# Patient Record
Sex: Female | Born: 1980 | Race: White | Hispanic: No | Marital: Single | Smoking: Former smoker
Health system: Southern US, Community
[De-identification: ages and names within clinical notes are randomized; demographics above are authoritative.]

## PROBLEM LIST (undated history)

## (undated) DIAGNOSIS — A6920 Lyme disease, unspecified: Secondary | ICD-10-CM

## (undated) DIAGNOSIS — J45909 Unspecified asthma, uncomplicated: Secondary | ICD-10-CM

## (undated) DIAGNOSIS — N159 Renal tubulo-interstitial disease, unspecified: Secondary | ICD-10-CM

## (undated) DIAGNOSIS — G629 Polyneuropathy, unspecified: Secondary | ICD-10-CM

## (undated) DIAGNOSIS — N289 Disorder of kidney and ureter, unspecified: Secondary | ICD-10-CM

## (undated) HISTORY — PX: AUGMENTATION MAMMAPLASTY: SUR837

## (undated) HISTORY — PX: BREAST ENHANCEMENT SURGERY: SHX7

## (undated) HISTORY — PX: TUBAL LIGATION: SHX77

## (undated) HISTORY — DX: Lyme disease, unspecified: A69.20

## (undated) HISTORY — DX: Polyneuropathy, unspecified: G62.9

---

## 2004-07-01 ENCOUNTER — Emergency Department (HOSPITAL_COMMUNITY): Admission: EM | Admit: 2004-07-01 | Discharge: 2004-07-02 | Payer: Self-pay | Admitting: Emergency Medicine

## 2004-07-01 ENCOUNTER — Emergency Department (HOSPITAL_COMMUNITY): Admission: EM | Admit: 2004-07-01 | Discharge: 2004-07-01 | Payer: Self-pay | Admitting: Emergency Medicine

## 2004-07-15 ENCOUNTER — Emergency Department (HOSPITAL_COMMUNITY): Admission: EM | Admit: 2004-07-15 | Discharge: 2004-07-15 | Payer: Self-pay | Admitting: Emergency Medicine

## 2004-07-27 ENCOUNTER — Emergency Department (HOSPITAL_COMMUNITY): Admission: EM | Admit: 2004-07-27 | Discharge: 2004-07-28 | Payer: Self-pay | Admitting: Emergency Medicine

## 2004-08-01 ENCOUNTER — Emergency Department (HOSPITAL_COMMUNITY): Admission: EM | Admit: 2004-08-01 | Discharge: 2004-08-01 | Payer: Self-pay | Admitting: Emergency Medicine

## 2004-08-09 ENCOUNTER — Emergency Department (HOSPITAL_COMMUNITY): Admission: EM | Admit: 2004-08-09 | Discharge: 2004-08-10 | Payer: Self-pay | Admitting: *Deleted

## 2004-09-22 ENCOUNTER — Emergency Department (HOSPITAL_COMMUNITY): Admission: EM | Admit: 2004-09-22 | Discharge: 2004-09-22 | Payer: Self-pay | Admitting: Emergency Medicine

## 2004-09-23 ENCOUNTER — Emergency Department (HOSPITAL_COMMUNITY): Admission: EM | Admit: 2004-09-23 | Discharge: 2004-09-23 | Payer: Self-pay | Admitting: Emergency Medicine

## 2004-12-19 ENCOUNTER — Emergency Department (HOSPITAL_COMMUNITY): Admission: EM | Admit: 2004-12-19 | Discharge: 2004-12-19 | Payer: Self-pay | Admitting: Emergency Medicine

## 2005-11-29 ENCOUNTER — Ambulatory Visit: Payer: Self-pay | Admitting: *Deleted

## 2005-11-29 ENCOUNTER — Ambulatory Visit: Payer: Self-pay | Admitting: Family Medicine

## 2006-01-22 ENCOUNTER — Emergency Department (HOSPITAL_COMMUNITY): Admission: EM | Admit: 2006-01-22 | Discharge: 2006-01-22 | Payer: Self-pay | Admitting: Emergency Medicine

## 2006-02-16 ENCOUNTER — Emergency Department (HOSPITAL_COMMUNITY): Admission: EM | Admit: 2006-02-16 | Discharge: 2006-02-16 | Payer: Self-pay | Admitting: Emergency Medicine

## 2006-04-30 ENCOUNTER — Emergency Department (HOSPITAL_COMMUNITY): Admission: EM | Admit: 2006-04-30 | Discharge: 2006-04-30 | Payer: Self-pay | Admitting: Emergency Medicine

## 2006-06-08 ENCOUNTER — Emergency Department (HOSPITAL_COMMUNITY): Admission: EM | Admit: 2006-06-08 | Discharge: 2006-06-08 | Payer: Self-pay | Admitting: Emergency Medicine

## 2006-09-10 ENCOUNTER — Emergency Department (HOSPITAL_COMMUNITY): Admission: EM | Admit: 2006-09-10 | Discharge: 2006-09-10 | Payer: Self-pay | Admitting: Emergency Medicine

## 2006-10-21 ENCOUNTER — Emergency Department (HOSPITAL_COMMUNITY): Admission: EM | Admit: 2006-10-21 | Discharge: 2006-10-21 | Payer: Self-pay | Admitting: Emergency Medicine

## 2007-03-14 ENCOUNTER — Emergency Department (HOSPITAL_COMMUNITY): Admission: EM | Admit: 2007-03-14 | Discharge: 2007-03-14 | Payer: Self-pay | Admitting: Family Medicine

## 2007-09-01 ENCOUNTER — Emergency Department (HOSPITAL_COMMUNITY): Admission: EM | Admit: 2007-09-01 | Discharge: 2007-09-01 | Payer: Self-pay | Admitting: Family Medicine

## 2007-10-20 ENCOUNTER — Emergency Department (HOSPITAL_COMMUNITY): Admission: EM | Admit: 2007-10-20 | Discharge: 2007-10-20 | Payer: Self-pay | Admitting: Emergency Medicine

## 2007-12-31 ENCOUNTER — Emergency Department (HOSPITAL_COMMUNITY): Admission: EM | Admit: 2007-12-31 | Discharge: 2007-12-31 | Payer: Self-pay | Admitting: Family Medicine

## 2009-01-10 ENCOUNTER — Emergency Department (HOSPITAL_COMMUNITY): Admission: EM | Admit: 2009-01-10 | Discharge: 2009-01-10 | Payer: Self-pay | Admitting: Family Medicine

## 2009-04-05 IMAGING — US US PELVIS COMPLETE MODIFY
1 series · 13 of 25 positions shown · non-contrast
Comparison: None

CLINICAL DATA: Vaginal bleeding and pelvic pain.

TRANSABDOMINAL AND TRANSVAGINAL PELVIC ULTRASOUND
TECHNIQUE: Both transabdominal and transvaginal ultrasound examinations of the
pelvis were performed including evaluation of the uterus, ovaries, adnexal
regions, and pelvic cul-de-sac.

[Series 1: unknown · 0.28mm/px · 13 of 60 slices shown]
[im 1/60]
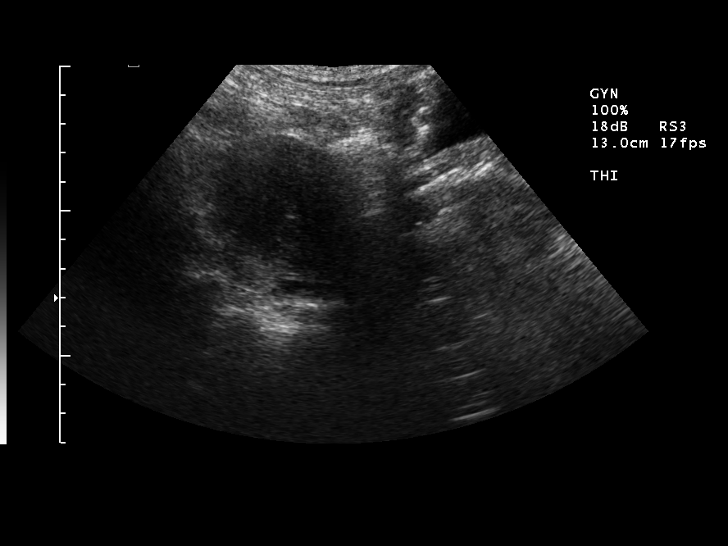
[im 5/60]
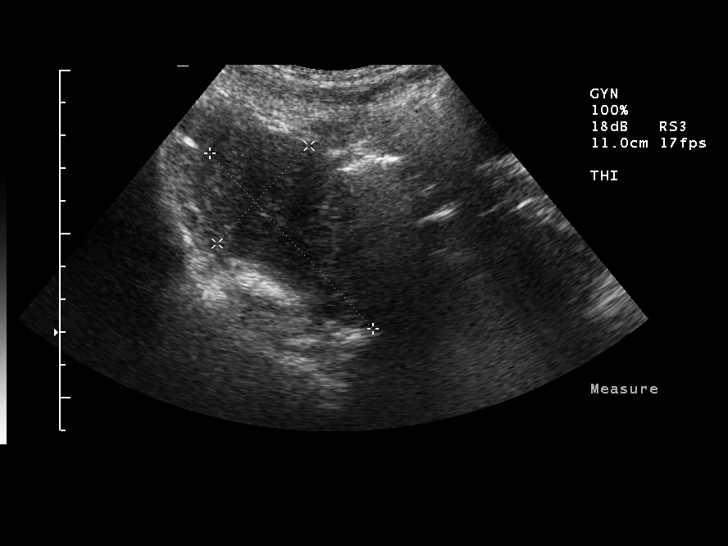
[im 10/60]
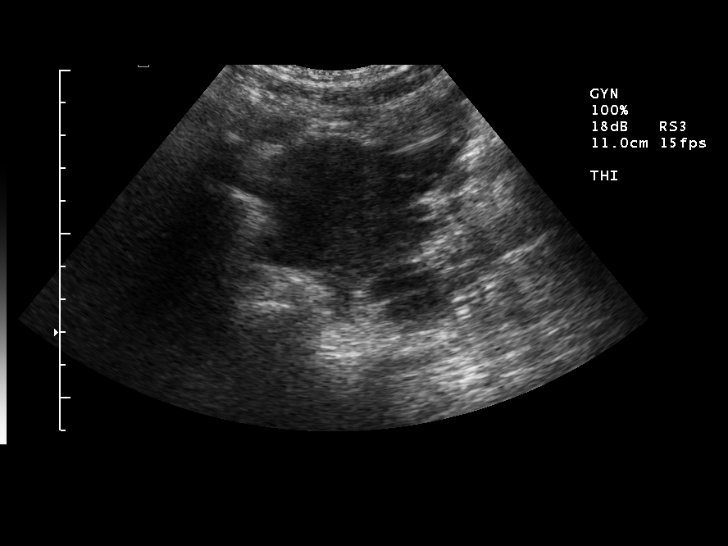
[im 15/60]
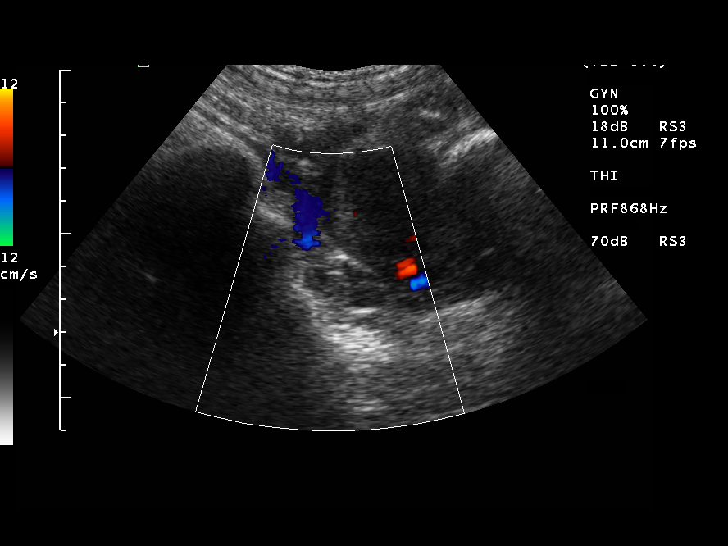
[im 20/60]
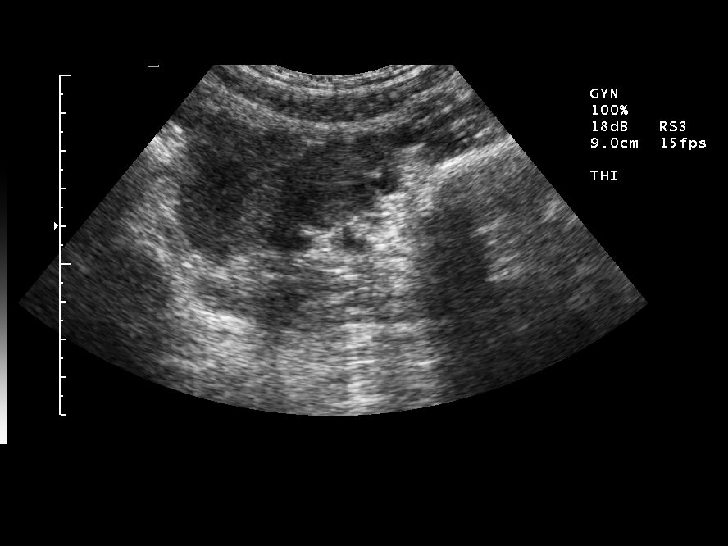
[im 25/60]
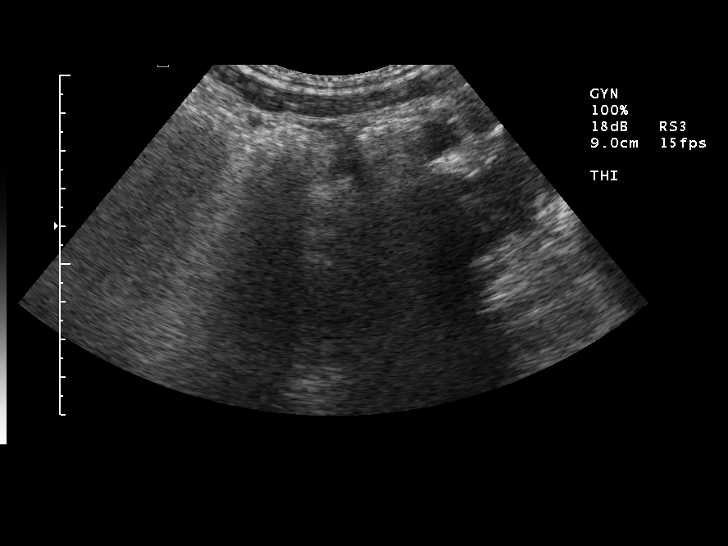
[im 30/60]
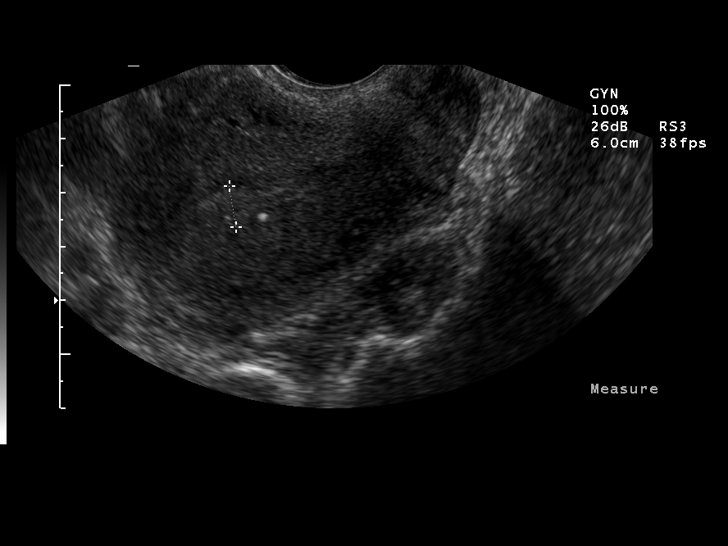
[im 35/60]
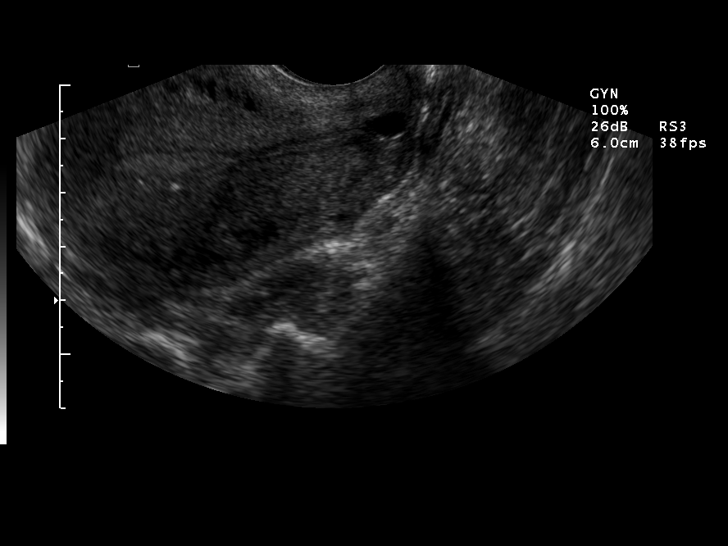
[im 40/60]
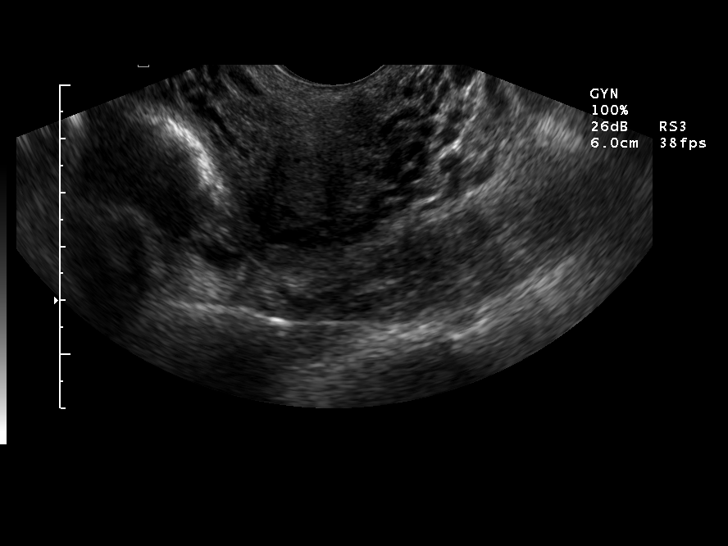
[im 45/60]
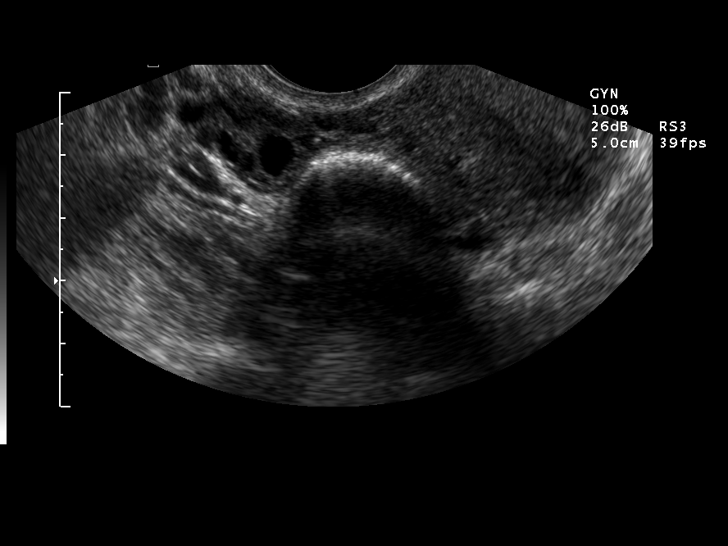
[im 50/60]
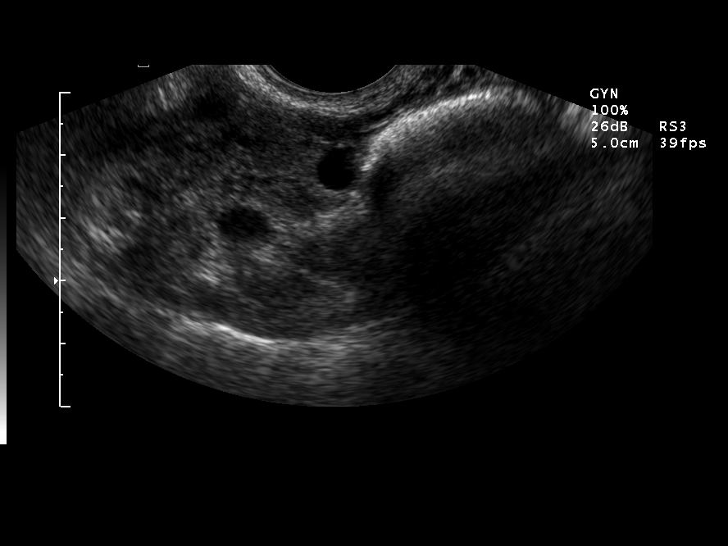
[im 55/60]
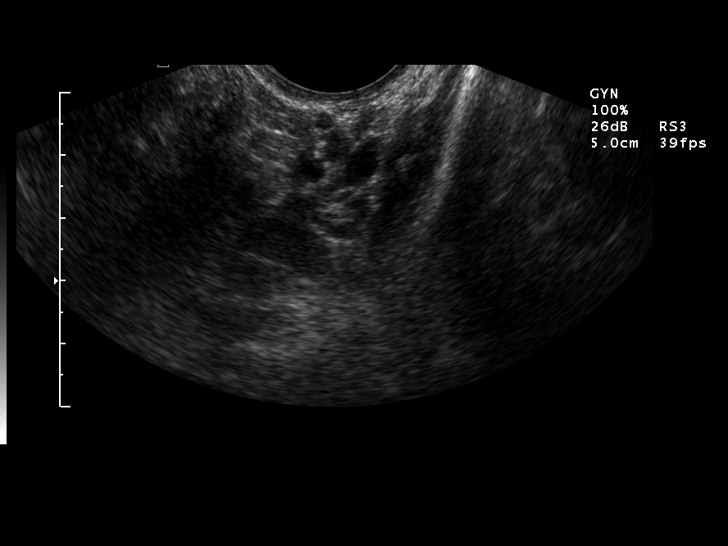
[im 60/60]
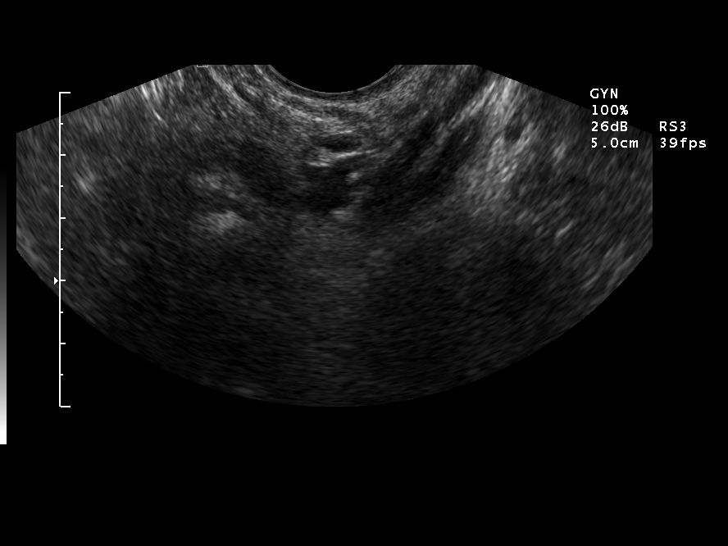

[13 of 25 positions shown; findings below may reference images not displayed]

FINDINGS: The left ovary measures 4.0 x 2.2 x 2.0 cm and demonstrates expected
internal parenchymal flow. The right ovary measures 1.4 x 2.5 x 1.5 cm, with
internal vascular flow identified. The uterus measures 7.2 cm in length and the
endometrial stripe measures 8 mm in thickness. A 6 mm nabothian cyst is present.

A trace amount of free pelvic fluid is present. This mild prominence of the
parametrial venous structures. Faint focus of echogenicity along the perforated
the endometrium is thought to be incidental and is shown on image 35.

IMPRESSION

1. Trace amount of free pelvic fluid noted.
2. Mild prominence of parametrial venous structures, probably incidental,
although prominent parametrial vasculature can really be a cause of pelvic pain.
3. Small echogenic focus along the endometrial margin. Typically such echogenic
foci are due to benign microcalcification.

## 2009-10-25 ENCOUNTER — Emergency Department (HOSPITAL_COMMUNITY): Admission: EM | Admit: 2009-10-25 | Discharge: 2009-10-25 | Payer: Self-pay | Admitting: Family Medicine

## 2009-11-01 ENCOUNTER — Emergency Department (HOSPITAL_COMMUNITY): Admission: EM | Admit: 2009-11-01 | Discharge: 2009-11-01 | Payer: Self-pay | Admitting: Family Medicine

## 2010-08-05 LAB — HIV ANTIBODY (ROUTINE TESTING W REFLEX): HIV: NONREACTIVE

## 2010-12-18 ENCOUNTER — Inpatient Hospital Stay (INDEPENDENT_AMBULATORY_CARE_PROVIDER_SITE_OTHER)
Admission: RE | Admit: 2010-12-18 | Discharge: 2010-12-18 | Disposition: A | Discharge status: Home / Self Care | Payer: Self-pay | Source: Ambulatory Visit | Attending: Family Medicine | Admitting: Family Medicine

## 2010-12-18 DIAGNOSIS — R599 Enlarged lymph nodes, unspecified: Secondary | ICD-10-CM

## 2010-12-18 DIAGNOSIS — B370 Candidal stomatitis: Secondary | ICD-10-CM

## 2010-12-18 LAB — COMPREHENSIVE METABOLIC PANEL
AST: 15 U/L (ref 0–37)
BUN: 13 mg/dL (ref 6–23)
CO2: 28 mEq/L (ref 19–32)
Calcium: 9.4 mg/dL (ref 8.4–10.5)
Chloride: 103 mEq/L (ref 96–112)
Creatinine, Ser: 0.71 mg/dL (ref 0.50–1.10)
GFR calc Af Amer: >60 mL/min (ref >60)
GFR calc non Af Amer: >60 mL/min (ref >60)
Glucose, Bld: 84 mg/dL (ref 70–99)
Total Bilirubin: 0.7 mg/dL (ref 0.3–1.2)

## 2010-12-18 LAB — CBC
HCT: 44.4 % (ref 36.0–46.0)
Hemoglobin: 15.5 g/dL — ABNORMAL HIGH (ref 12.0–15.0)
MCH: 31.6 pg (ref 26.0–34.0)
MCV: 90.4 fL (ref 78.0–100.0)
RBC: 4.91 MIL/uL (ref 3.87–5.11)
WBC: 6.4 10*3/uL (ref 4.0–10.5)

## 2010-12-18 LAB — DIFFERENTIAL
Eosinophils Absolute: 0.1 10*3/uL (ref 0.0–0.7)
Lymphocytes Relative: 22 % (ref 12–46)
Lymphs Abs: 1.4 10*3/uL (ref 0.7–4.0)
Monocytes Relative: 9 % (ref 3–12)
Neutro Abs: 4.3 10*3/uL (ref 1.7–7.7)
Neutrophils Relative %: 68 % (ref 43–77)

## 2010-12-18 LAB — HIV ANTIBODY (ROUTINE TESTING W REFLEX): HIV: NONREACTIVE

## 2011-02-12 LAB — INFLUENZA A AND B ANTIGEN (CONVERTED LAB)

## 2011-02-14 LAB — URINE MICROSCOPIC-ADD ON

## 2011-02-14 LAB — URINALYSIS, ROUTINE W REFLEX MICROSCOPIC
Bilirubin Urine: NEGATIVE
Glucose, UA: NEGATIVE
Ketones, ur: NEGATIVE
Nitrite: NEGATIVE
Specific Gravity, Urine: 1.007
pH: 6

## 2011-02-14 LAB — PREGNANCY, URINE

## 2011-02-15 LAB — POCT URINALYSIS DIP (DEVICE)
Glucose, UA: NEGATIVE
Nitrite: POSITIVE — AB
Protein, ur: NEGATIVE
Specific Gravity, Urine: 1.015
Urobilinogen, UA: 1

## 2011-02-15 LAB — URINE CULTURE

## 2011-03-07 LAB — URINE MICROSCOPIC-ADD ON

## 2011-03-07 LAB — URINALYSIS, ROUTINE W REFLEX MICROSCOPIC
Nitrite: NEGATIVE
Protein, ur: 30 — AB
Specific Gravity, Urine: 1.022
Urobilinogen, UA: 1

## 2011-03-07 LAB — PREGNANCY, URINE

## 2011-05-22 ENCOUNTER — Encounter: Payer: Self-pay | Admitting: *Deleted

## 2011-05-22 ENCOUNTER — Emergency Department (HOSPITAL_COMMUNITY)
Admission: EM | Admit: 2011-05-22 | Discharge: 2011-05-22 | Disposition: A | Discharge status: Home / Self Care | Payer: Self-pay | Attending: Emergency Medicine | Admitting: Emergency Medicine

## 2011-05-22 DIAGNOSIS — F17200 Nicotine dependence, unspecified, uncomplicated: Secondary | ICD-10-CM | POA: Insufficient documentation

## 2011-05-22 DIAGNOSIS — R109 Unspecified abdominal pain: Secondary | ICD-10-CM | POA: Insufficient documentation

## 2011-05-22 DIAGNOSIS — R42 Dizziness and giddiness: Secondary | ICD-10-CM | POA: Insufficient documentation

## 2011-05-22 HISTORY — DX: Disorder of kidney and ureter, unspecified: N28.9

## 2011-05-22 LAB — CBC
MCH: 30.4 pg (ref 26.0–34.0)
MCV: 91 fL (ref 78.0–100.0)
Platelets: 180 10*3/uL (ref 150–400)
RDW: 12.7 % (ref 11.5–15.5)
WBC: 5.3 10*3/uL (ref 4.0–10.5)

## 2011-05-22 LAB — COMPREHENSIVE METABOLIC PANEL
AST: 13 U/L (ref 0–37)
CO2: 27 mEq/L (ref 19–32)
Chloride: 103 mEq/L (ref 96–112)
Creatinine, Ser: 0.66 mg/dL (ref 0.50–1.10)
GFR calc Af Amer: >90 mL/min (ref >90)
GFR calc non Af Amer: >90 mL/min (ref >90)
Glucose, Bld: 77 mg/dL (ref 70–99)
Total Bilirubin: 0.6 mg/dL (ref 0.3–1.2)

## 2011-05-22 LAB — DIFFERENTIAL
Basophils Absolute: 0 10*3/uL (ref 0.0–0.1)
Basophils Relative: 1 % (ref 0–1)
Eosinophils Relative: 2 % (ref 0–5)
Lymphocytes Relative: 26 % (ref 12–46)
Lymphs Abs: 1.4 10*3/uL (ref 0.7–4.0)
Monocytes Absolute: 0.5 10*3/uL (ref 0.1–1.0)
Monocytes Relative: 8 % (ref 3–12)

## 2011-05-22 LAB — URINALYSIS, ROUTINE W REFLEX MICROSCOPIC
Bilirubin Urine: NEGATIVE
Glucose, UA: NEGATIVE mg/dL
Hgb urine dipstick: NEGATIVE
Ketones, ur: NEGATIVE mg/dL
Protein, ur: NEGATIVE mg/dL

## 2011-05-22 LAB — POCT PREGNANCY, URINE: Preg Test, Ur: NEGATIVE

## 2011-05-22 MED ORDER — DICYCLOMINE HCL 20 MG PO TABS: 20.0000 mg | ORAL_TABLET | Freq: Two times a day (BID) | ORAL | Status: DC | PRN

## 2011-05-22 NOTE — ED Provider Notes (Signed)
Patient's had abdominal pain since April. The spells today. Lab work is reassuring except for mild hypokalemia. She will adjust her dieHistory     CSN: [id number]  Arrival date & time 05/22/11  1202   First MD Initiated Contact with Patient 05/22/11 1516      Chief Complaint  Patient presents with  . Abdominal Pain    (Consider location/radiation/quality/duration/timing/severity/associated sxs/prior treatment) Patient is a 31 y.o. female presenting with abdominal pain. The history is provided by the patient.  Abdominal Pain The primary symptoms of the illness include abdominal pain. The primary symptoms of the illness do not include shortness of breath, nausea, vomiting or diarrhea.  Symptoms associated with the illness do not include back pain.   a patient states she's having abdominal pain since April. She states she is dizzy not feeling well. She states that when she eats things sometimes it hurts. She states she sought urgent care for her and told her hemoglobin is up. She states that she did seen [facility] and he told her she may have had a parasite. No nausea vomiting or diarrhea. She states she's lost a few pounds from last few days. No blood in her stool or change in her stools. She states she's back on probiotics. She states her plastic surgeon told her she may need her B12 checked. Patient states she thinks that she has celiac disease because she eats a lot of wheat.  Past Medical History  Diagnosis Date  . Renal disorder     under active right kidney    Past Surgical History  Procedure Date  . Tubal ligation   . Breast enhancement surgery     No family history on file.  History  Substance Use Topics  . Smoking status: Current Everyday Smoker -- 1.0 packs/day  . Smokeless tobacco: Not on file  . Alcohol Use: Yes     rarely    OB History    Grav Para Term Preterm Abortions TAB SAB Ect Mult Living                  Review of Systems  Constitutional:  Negative for activity change and appetite change.  HENT: Negative for neck stiffness.   Eyes: Negative for pain.  Respiratory: Negative for chest tightness and shortness of breath.   Cardiovascular: Negative for chest pain and leg swelling.  Gastrointestinal: Positive for abdominal pain. Negative for nausea, vomiting, diarrhea and anal bleeding.  Genitourinary: Negative for flank pain.  Musculoskeletal: Negative for back pain.  Skin: Negative for rash.  Neurological: Positive for dizziness. Negative for weakness, numbness and headaches.  Psychiatric/Behavioral: Negative for behavioral problems.    Allergies  Flagyl  Home Medications   Current Outpatient Rx  Name Route Sig Dispense Refill  . IBUPROFEN 200 MG PO TABS Oral Take 600 mg by mouth every 6 (six) hours as needed. For pain.     Marland Kitchen SOLUBLE FIBER/PROBIOTICS PO Oral Take 3 tablets by mouth at bedtime.     Marland Kitchen DICYCLOMINE HCL 20 MG PO TABS Oral Take 1 tablet (20 mg total) by mouth 2 (two) times daily as needed. 20 tablet 0    BP 119/76  Pulse 73  Temp(Src) 98 F (36.7 C) (Oral)  Resp 18  Wt 104 lb 0.9 oz (47.2 kg)  SpO2 100%  LMP 05/19/2011  Physical Exam  Nursing note and vitals reviewed. Constitutional: She is oriented to person, place, and time. She appears well-developed and well-nourished.  HENT:  Head: Normocephalic  and atraumatic.  Eyes: EOM are normal. Pupils are equal, round, and reactive to light.  Neck: Normal range of motion. Neck supple.  Cardiovascular: Normal rate, regular rhythm and normal heart sounds.   No murmur heard. Pulmonary/Chest: Effort normal and breath sounds normal. No respiratory distress. She has no wheezes. She has no rales.  Abdominal: Soft. Bowel sounds are normal. She exhibits no distension. There is no tenderness. There is no rebound and no guarding.  Musculoskeletal: Normal range of motion.  Neurological: She is alert and oriented to person, place, and time. No cranial nerve deficit.    Skin: Skin is warm and dry.  Psychiatric: She has a normal mood and affect. Her speech is normal.    ED Course  Procedures (including critical care time)  Labs Reviewed  COMPREHENSIVE METABOLIC PANEL - Abnormal; Notable for the following:    Potassium 3.4 (*)    All other components within normal limits  URINALYSIS, ROUTINE W REFLEX MICROSCOPIC  POCT PREGNANCY, URINE  LIPASE, BLOOD  CBC  DIFFERENTIAL  POCT PREGNANCY, URINE   No results found.   1. Abdominal pain       MDM  Patient's had abdominal pain for almost a year. Cramping pain states felt dizzy today. She is a minimal hypokalemia that she will adjust her diet 4. She will followup with GI.        Juliet Rude. Rubin Payor, MD 05/22/11 1757

## 2011-05-22 NOTE — ED Notes (Signed)
Pt states "been having abd pain since April, but now I'm having dizzy spells and overall not feeling well"

## 2011-07-07 ENCOUNTER — Encounter (HOSPITAL_COMMUNITY): Payer: Self-pay | Admitting: *Deleted

## 2011-07-07 ENCOUNTER — Emergency Department (HOSPITAL_COMMUNITY)
Admission: EM | Admit: 2011-07-07 | Discharge: 2011-07-08 | Disposition: A | Discharge status: Home / Self Care | Payer: Self-pay | Attending: Emergency Medicine | Admitting: Emergency Medicine

## 2011-07-07 DIAGNOSIS — L299 Pruritus, unspecified: Secondary | ICD-10-CM | POA: Insufficient documentation

## 2011-07-07 DIAGNOSIS — B880 Other acariasis: Secondary | ICD-10-CM | POA: Insufficient documentation

## 2011-07-07 DIAGNOSIS — B889 Infestation, unspecified: Secondary | ICD-10-CM

## 2011-07-07 MED ORDER — PERMETHRIN 1 % EX LOTN: TOPICAL_LOTION | Freq: Once | CUTANEOUS | Status: DC

## 2011-07-07 MED ORDER — IVERMECTIN 3 MG PO TABS: 3.0000 mg | ORAL_TABLET | Freq: Once | ORAL | Status: DC

## 2011-07-07 NOTE — ED Provider Notes (Signed)
History     CSN: [id number]  Arrival date & time 07/07/11  2051   First MD Initiated Contact with Patient 07/07/11 2259      Chief Complaint  Patient presents with  . Rash    (Consider location/radiation/quality/duration/timing/severity/associated sxs/prior treatment) Patient is a 31 y.o. female presenting with rash. The history is provided by the patient.  Rash  This is a new problem. The current episode started more than 1 week ago. The problem has been gradually worsening. The problem is associated with an insect bite/sting. There has been no fever. The rash is present on the scalp, right ankle, left ankle, back, face, left arm and right arm. The patient is experiencing no pain. Associated symptoms include itching. She has tried antihistamines for the symptoms. The treatment provided mild relief. Risk factors include new environmental exposures.   Pt states that she has had a generalized rash which she first noted about 2 weeks ago. It is extremely itchy and this worsens at night. The lesions are located all over her body and she has had intense scalp itching. Denies fever, chills. She has been taking vistaril at night which has allowed her to sleep. Her boyfriend has similar lesions. She states that her dog has been having some itching and has been losing his hair; he sleeps on the bed with them and she is concerned that the dog may have a mite infestation, which he has spread to them.  Past Medical History  Diagnosis Date  . Renal disorder     under active right kidney    Past Surgical History  Procedure Date  . Tubal ligation   . Breast enhancement surgery     No family history on file.  History  Substance Use Topics  . Smoking status: Current Everyday Smoker -- 1.0 packs/day  . Smokeless tobacco: Not on file  . Alcohol Use: Yes     rarely    OB History    Grav Para Term Preterm Abortions TAB SAB Ect Mult Living                  Review of Systems    Constitutional: Negative.   HENT: Negative.   Eyes: Negative.   Gastrointestinal: Negative for nausea and vomiting.  Musculoskeletal: Negative for myalgias.  Skin: Positive for itching and rash. Negative for color change.  Neurological: Negative.     Allergies  Flagyl  Home Medications   Current Outpatient Rx  Name Route Sig Dispense Refill  . ALBUTEROL SULFATE HFA 108 (90 BASE) MCG/ACT IN AERS Inhalation Inhale 2 puffs into the lungs every 6 (six) hours as needed. For emergency asthma symoms    . HYDROXYZINE HCL 25 MG PO TABS Oral Take 25 mg by mouth at bedtime.    . IBUPROFEN 200 MG PO TABS Oral Take 600 mg by mouth every 6 (six) hours as needed. For pain.     Marland Kitchen SOLUBLE FIBER/PROBIOTICS PO Oral Take 3 tablets by mouth at bedtime.       BP 127/83  Pulse 89  Temp(Src) 98.1 F (36.7 C) (Oral)  Resp 17  Ht 5\' 9"  (1.753 m)  Wt 109 lb (49.442 kg)  BMI 16.10 kg/m2  SpO2 100%  Physical Exam  Nursing note and vitals reviewed. Constitutional: She is oriented to person, place, and time. She appears well-developed and well-nourished. No distress.       Uncomfortable appearing  HENT:  Head: Normocephalic and atraumatic.  Right Ear: External ear normal.  Left Ear: External ear normal.  Neck: Normal range of motion.  Cardiovascular: Normal rate.   Pulmonary/Chest: Effort normal.  Musculoskeletal: Normal range of motion.  Neurological: She is alert and oriented to person, place, and time.  Skin: Skin is warm and dry. She is not diaphoretic.       Scattered, red macules which appear consistent with insect bite. These are on her face, worst on the left side of her face (she sleeps with this side touching the pillow). Additional lesions seen on upper back, ankles, arms. No lesions seen to scalp. There are not any lesions seen to the hands/web space, around the waistline. No burrow marks seen.  Psychiatric: She has a normal mood and affect.    ED Course  Procedures (including  critical care time)  Labs Reviewed - No data to display No results found.   1. Mite infestation       MDM  Pt with about 2 wks of rash. Lesions are not in classic scabies distribution and I do not see any obvious burrow marks. They do not quite appear consistent with flea bites. However, the lesions do appear to be bites of some sort and her boyfriend also has similar lesions. Will treat as possible mite infection stemming from dog. She was instructed to wash all bedding and to have the dog treated. She has an appt coming up with derm which she was instructed to keep. Return precautions discussed.        Grant Fontana, [place] 07/08/11 1344  Medical screening examination/treatment/procedure(s) were performed by non-physician practitioner and as supervising physician I was immediately available for consultation/collaboration.  Sunnie Nielsen, MD 07/09/11 [id number]

## 2011-07-07 NOTE — ED Notes (Signed)
Pt noticed a generalized rash circa 1.5 weeks ago.  Pt states that she itches severely and mostly at night.  Pt is visibly irritated and very uncomfortable.

## 2011-07-07 NOTE — Discharge Instructions (Signed)
Please see the attached information for more information on scabies/mite infestations. Please take the medications as prescribed. Make sure that your dog is treated. Wash all of your bedding in hot water. Keep your appointment with your dermatologist. Return to the ER if you have high fever or other worrisome symptoms.  RESOURCE GUIDE  Dental Problems  Patients with Medicaid: [facility] [phone]                                           [phone] [address] [employer] [address] Phone:  [phone]                                                  Phone:  [phone]  If unable to pay or uninsured, contact:  [facility] or [facility]. to become qualified for the adult dental clinic.  Chronic Pain Problems Contact [facility]  [phone] Patients need to be referred by their primary care doctor.  Insufficient Money for Medicine Contact [employer]:  call "211" or [facility] [phone].  No Primary Care Doctor Call [facility]  [phone] Other agencies that provide inexpensive medical care    [facility]  [phone]    [facility]  [phone]    [facility]  [phone]    [facility]  [phone]    [facility]  [phone]    [facility]  [phone]  Psychological Services [facility]  [phone] [facility]  [phone] [facility] [place] [facility]   [phone] (emergency services [phone])  Substance Abuse Resources [facility]  [phone] [facility] [phone] [facility] [phone] Floyd[facility] Flock [phone] Residential & Outpatient Substance Abuse Program  [phone]  Abuse/Neglect [facility] Child Abuse Hotline [phone] [facility] Child Abuse Hotline [phone] [phone] (After Hours)  Emergency Shelter [facility] [phone]  Maternity Homes Room  at the [place] [facility] [place] [phone] [facility] [phone]  MRSA Hotline #:   830-423-7003    [facility] Resources  [facility] [place] [facility]     [employer]                          [facility] Dept. [address] [place]                       [address]      [address]  [place]                                                Cristobal Goldmann Phone:  [phone]  Phone:  [phone]                 Phone:  [phone]  [place] [facility] Phone:  [phone]  [place] Child Abuse Hotline [phone] (After Hours)   

## 2011-07-08 MED ORDER — PERMETHRIN 5 % EX CREA: TOPICAL_CREAM | CUTANEOUS | Status: AC

## 2011-07-23 ENCOUNTER — Other Ambulatory Visit (INDEPENDENT_AMBULATORY_CARE_PROVIDER_SITE_OTHER): Payer: Self-pay

## 2011-07-23 DIAGNOSIS — E049 Nontoxic goiter, unspecified: Secondary | ICD-10-CM

## 2011-07-23 DIAGNOSIS — H8309 Labyrinthitis, unspecified ear: Secondary | ICD-10-CM

## 2011-07-23 DIAGNOSIS — E559 Vitamin D deficiency, unspecified: Secondary | ICD-10-CM

## 2011-07-25 ENCOUNTER — Encounter (INDEPENDENT_AMBULATORY_CARE_PROVIDER_SITE_OTHER): Payer: Self-pay | Admitting: Obstetrics and Gynecology

## 2011-07-25 DIAGNOSIS — R109 Unspecified abdominal pain: Secondary | ICD-10-CM

## 2011-08-01 ENCOUNTER — Emergency Department (HOSPITAL_COMMUNITY)
Admission: EM | Admit: 2011-08-01 | Discharge: 2011-08-01 | Disposition: A | Discharge status: Home / Self Care | Payer: Self-pay | Attending: Emergency Medicine | Admitting: Emergency Medicine

## 2011-08-01 ENCOUNTER — Encounter (HOSPITAL_COMMUNITY): Payer: Self-pay

## 2011-08-01 DIAGNOSIS — R202 Paresthesia of skin: Secondary | ICD-10-CM

## 2011-08-01 DIAGNOSIS — R1013 Epigastric pain: Secondary | ICD-10-CM | POA: Insufficient documentation

## 2011-08-01 DIAGNOSIS — R209 Unspecified disturbances of skin sensation: Secondary | ICD-10-CM | POA: Insufficient documentation

## 2011-08-01 DIAGNOSIS — R5381 Other malaise: Secondary | ICD-10-CM | POA: Insufficient documentation

## 2011-08-01 DIAGNOSIS — R110 Nausea: Secondary | ICD-10-CM | POA: Insufficient documentation

## 2011-08-01 DIAGNOSIS — R51 Headache: Secondary | ICD-10-CM | POA: Insufficient documentation

## 2011-08-01 DIAGNOSIS — M2550 Pain in unspecified joint: Secondary | ICD-10-CM

## 2011-08-01 DIAGNOSIS — F17200 Nicotine dependence, unspecified, uncomplicated: Secondary | ICD-10-CM | POA: Insufficient documentation

## 2011-08-01 LAB — DIFFERENTIAL
Basophils Relative: 1 % (ref 0–1)
Lymphs Abs: 1.9 10*3/uL (ref 0.7–4.0)
Monocytes Relative: 10 % (ref 3–12)
Neutro Abs: 3.7 10*3/uL (ref 1.7–7.7)
Neutrophils Relative %: 58 % (ref 43–77)

## 2011-08-01 LAB — COMPREHENSIVE METABOLIC PANEL
ALT: 7 U/L (ref 0–35)
Albumin: 3.9 g/dL (ref 3.5–5.2)
Alkaline Phosphatase: 40 U/L (ref 39–117)
BUN: 13 mg/dL (ref 6–23)
Chloride: 105 mEq/L (ref 96–112)
Glucose, Bld: 79 mg/dL (ref 70–99)
Potassium: 3.7 mEq/L (ref 3.5–5.1)
Sodium: 139 mEq/L (ref 135–145)
Total Bilirubin: 0.4 mg/dL (ref 0.3–1.2)

## 2011-08-01 LAB — URINALYSIS, ROUTINE W REFLEX MICROSCOPIC
Bilirubin Urine: NEGATIVE
Glucose, UA: NEGATIVE mg/dL
Ketones, ur: NEGATIVE mg/dL
Nitrite: NEGATIVE
pH: 5.5 (ref 5.0–8.0)

## 2011-08-01 LAB — CBC
Hemoglobin: 14.6 g/dL (ref 12.0–15.0)
RBC: 4.68 MIL/uL (ref 3.87–5.11)

## 2011-08-01 LAB — URINE MICROSCOPIC-ADD ON

## 2011-08-01 LAB — LIPASE, BLOOD: Lipase: 23 U/L (ref 11–59)

## 2011-08-01 MED ORDER — ONDANSETRON HCL 4 MG/2ML IJ SOLN
4.0000 mg | Freq: Once | INTRAMUSCULAR | Status: AC
  Administered 2011-08-01: 4 mg via INTRAVENOUS
  Filled 2011-08-01: qty 2

## 2011-08-01 MED ORDER — ONDANSETRON HCL 4 MG PO TABS: 4.0000 mg | ORAL_TABLET | Freq: Four times a day (QID) | ORAL | Status: AC

## 2011-08-01 MED ORDER — SODIUM CHLORIDE 0.9 % IV BOLUS (SEPSIS)
1000.0000 mL | Freq: Once | INTRAVENOUS | Status: AC
  Administered 2011-08-01: 1000 mL via INTRAVENOUS

## 2011-08-01 NOTE — ED Provider Notes (Signed)
History     CSN: [id number]  Arrival date & time 08/01/11  1458   First MD Initiated Contact with Patient 08/01/11 1731      Chief Complaint  Patient presents with  . Joint Pain  . Nausea  . Abdominal Cramping    (Consider location/radiation/quality/duration/timing/severity/associated sxs/prior treatment) HPI Comments: Patient is 31 year old otherwise healthy female who presents with a several month history of multiple complaints.  She states that this started when she moved into her current residence where she believes she began to have an itchy rash to her skin, the sensation that her skin is burning, and she feels like her "blood is boiling" at times - she reports she also has clumps of hair that have been falling out and diffuse joint pain.  She states that nausea and abdominal cramping after eating anything at all.  She states she is also having non-traumatic "petechiae" that come up on her arms - she states that she has seen her PCP for these things and had "all kinds of tests drawn" but that no one can find out what is wrong.  She is concerned that she may have gotten exposed to some sort of toxin because her husband is also having same symptoms as well as her dog.  She would like for [place] to test her "for all the toxins we can".  She reports headache, blurred vision, worsening of her migraines, shortness of breath, nausea but no vomiting, epigastric abdominal pain, decrease in urination.  Patient is a 31 y.o. female presenting with cramps. The history is provided by the patient. No language interpreter was used.  Abdominal Cramping The primary symptoms of the illness include abdominal pain, fatigue, nausea and hematochezia. The primary symptoms of the illness do not include fever, shortness of breath, vomiting, diarrhea, hematemesis, dysuria, vaginal discharge or vaginal bleeding. The current episode started more than 2 days ago. The onset of the illness was gradual. The problem has been  gradually worsening.  The illness is associated with eating. The patient states that she believes she is currently not pregnant. The patient has not had a change in bowel habit. Additional symptoms associated with the illness include anorexia. Symptoms associated with the illness do not include chills, diaphoresis, heartburn, constipation, urgency, hematuria, frequency or back pain.    Past Medical History  Diagnosis Date  . Renal disorder     under active right kidney    Past Surgical History  Procedure Date  . Tubal ligation   . Breast enhancement surgery     No family history on file.  History  Substance Use Topics  . Smoking status: Current Everyday Smoker -- 1.0 packs/day  . Smokeless tobacco: Not on file  . Alcohol Use: Yes     rarely    OB History    Grav Para Term Preterm Abortions TAB SAB Ect Mult Living                  Review of Systems  Constitutional: Positive for fatigue. Negative for fever, chills and diaphoresis.  HENT: Positive for facial swelling. Negative for ear pain, congestion and neck pain.   Eyes: Positive for visual disturbance.  Respiratory: Negative for shortness of breath.   Cardiovascular: Positive for leg swelling. Negative for chest pain.  Gastrointestinal: Positive for nausea, abdominal pain, hematochezia and anorexia. Negative for heartburn, vomiting, diarrhea, constipation and hematemesis.  Genitourinary: Negative for dysuria, urgency, frequency, hematuria, vaginal bleeding and vaginal discharge.  Musculoskeletal: Negative for  back pain.  Neurological: Positive for headaches.  All other systems reviewed and are negative.    Allergies  Flagyl  Home Medications   Current Outpatient Rx  Name Route Sig Dispense Refill  . HYDROXYZINE HCL 25 MG PO TABS Oral Take 25 mg by mouth at bedtime.    . IBUPROFEN 200 MG PO TABS Oral Take 600 mg by mouth every 6 (six) hours as needed. For pain.     . IVERMECTIN 3 MG PO TABS Oral Take 1 tablet (3  mg total) by mouth once. 3 tablet 0  . PERMETHRIN 5 % EX CREA Topical Apply 1 application topically once.    . ALBUTEROL SULFATE HFA 108 (90 BASE) MCG/ACT IN AERS Inhalation Inhale 2 puffs into the lungs every 6 (six) hours as needed. For emergency asthma symoms      BP 108/78  Pulse 85  Temp(Src) 98.7 F (37.1 C) (Oral)  Resp 16  SpO2 98%  LMP 07/16/2011  Physical Exam  Nursing note and vitals reviewed. Constitutional: She is oriented to person, place, and time. She appears well-developed and well-nourished. No distress.  HENT:  Head: Normocephalic and atraumatic.  Right Ear: External ear normal.  Left Ear: External ear normal.  Nose: Nose normal.  Mouth/Throat: Oropharynx is clear and moist. No oropharyngeal exudate.  Eyes: Conjunctivae are normal. Pupils are equal, round, and reactive to light. No scleral icterus.  Neck: Normal range of motion. Neck supple.  Cardiovascular: Normal rate, regular rhythm and normal heart sounds.  Exam reveals no gallop and no friction rub.   No murmur heard. Pulmonary/Chest: Effort normal and breath sounds normal. No respiratory distress. She has no wheezes. She has no rales. She exhibits no tenderness.  Abdominal: Soft. Bowel sounds are normal. She exhibits no distension and no mass. There is tenderness in the epigastric area and left upper quadrant. There is no rebound and no guarding.    Musculoskeletal: Normal range of motion. She exhibits no edema and no tenderness.  Lymphadenopathy:    She has no cervical adenopathy.  Neurological: She is alert and oriented to person, place, and time. No cranial nerve deficit.  Skin: Skin is warm and dry. No rash noted. No erythema. No pallor.  Psychiatric: She has a normal mood and affect. Her behavior is normal. Judgment and thought content normal.    ED Course  Procedures (including critical care time)   Labs Reviewed  CBC  DIFFERENTIAL  COMPREHENSIVE METABOLIC PANEL  LIPASE, BLOOD   No  results found. Results for orders placed during the hospital encounter of 08/01/11  CBC      Component Value Range   WBC 6.3  4.0 - 10.5 (K/uL)   RBC 4.68  3.87 - 5.11 (MIL/uL)   Hemoglobin 14.6  12.0 - 15.0 (g/dL)   HCT 16.1  09.6 - 04.5 (%)   MCV 90.0  78.0 - 100.0 (fL)   MCH 31.2  26.0 - 34.0 (pg)   MCHC 34.7  30.0 - 36.0 (g/dL)   RDW 40.9  81.1 - 91.4 (%)   Platelets 183  150 - 400 (K/uL)  DIFFERENTIAL      Component Value Range   Neutrophils Relative 58  43 - 77 (%)   Neutro Abs 3.7  1.7 - 7.7 (K/uL)   Lymphocytes Relative 30  12 - 46 (%)   Lymphs Abs 1.9  0.7 - 4.0 (K/uL)   Monocytes Relative 10  3 - 12 (%)   Monocytes Absolute 0.6  0.1 -  1.0 (K/uL)   Eosinophils Relative 1  0 - 5 (%)   Eosinophils Absolute 0.1  0.0 - 0.7 (K/uL)   Basophils Relative 1  0 - 1 (%)   Basophils Absolute 0.0  0.0 - 0.1 (K/uL)  COMPREHENSIVE METABOLIC PANEL      Component Value Range   Sodium 139  135 - 145 (mEq/L)   Potassium 3.7  3.5 - 5.1 (mEq/L)   Chloride 105  96 - 112 (mEq/L)   CO2 28  19 - 32 (mEq/L)   Glucose, Bld 79  70 - 99 (mg/dL)   BUN 13  6 - 23 (mg/dL)   Creatinine, Ser 6.57  0.50 - 1.10 (mg/dL)   Calcium 9.1  8.4 - 84.6 (mg/dL)   Total Protein 6.7  6.0 - 8.3 (g/dL)   Albumin 3.9  3.5 - 5.2 (g/dL)   AST 12  0 - 37 (U/L)   ALT 7  0 - 35 (U/L)   Alkaline Phosphatase 40  39 - 117 (U/L)   Total Bilirubin 0.4  0.3 - 1.2 (mg/dL)   GFR calc non Af Amer >90  >90 (mL/min)   GFR calc Af Amer >90  >90 (mL/min)  LIPASE, BLOOD      Component Value Range   Lipase 23  11 - 59 (U/L)   No results found.    Paresthesias Upper abdominal pain Rash    MDM  Patient with multiple non-descript complaints without any alarming findings on labs, She reports having had these same symptoms for months, I do not believe there is an emergency situation and have encouraged the patient to follow up with her PCP for further evaluation or referral.        Scarlette Calico C. [place], [place] 08/01/11  2038

## 2011-08-01 NOTE — ED Notes (Signed)
Pt c/o joint pain x2wks, nausea with abd cramping x57months, vomit/diarrhea last wk

## 2011-08-01 NOTE — Discharge Instructions (Signed)
Abdominal Pain Abdominal pain can be caused by many things. Your caregiver decides the seriousness of your pain by an examination and possibly blood tests and X-rays. Many cases can be observed and treated at home. Most abdominal pain is not caused by a disease and will probably improve without treatment. However, in many cases, more time must pass before a clear cause of the pain can be found. Before that point, it may not be known if you need more testing, or if hospitalization or surgery is needed. HOME CARE INSTRUCTIONS   Do not take laxatives unless directed by your caregiver.   Take pain medicine only as directed by your caregiver.   Only take over-the-counter or prescription medicines for pain, discomfort, or fever as directed by your caregiver.   Try a clear liquid diet (broth, tea, or water) for as long as directed by your caregiver. Slowly move to a bland diet as tolerated.  SEEK IMMEDIATE MEDICAL CARE IF:   The pain does not go away.   You have a fever.   You keep throwing up (vomiting).   The pain is felt only in portions of the abdomen. Pain in the right side could possibly be appendicitis. In an adult, pain in the left lower portion of the abdomen could be colitis or diverticulitis.   You pass bloody or black tarry stools.  MAKE SURE YOU:   Understand these instructions.   Will watch your condition.   Will get help right away if you are not doing well or get worse.  Document Released: 02/13/2005 Document Revised: 04/25/2011 Document Reviewed: 12/23/2007 [facility] Patient Information 2012 [place], [place].Arthralgia Your caregiver has diagnosed you as suffering from an arthralgia. Arthralgia means there is pain in a joint. This can come from many reasons including:  Bruising the joint which causes soreness (inflammation) in the joint.   Wear and tear on the joints which occur as we grow older (osteoarthritis).   Overusing the joint.   Various forms of arthritis.    Infections of the joint.  Regardless of the cause of pain in your joint, most of these different pains respond to anti-inflammatory drugs and rest. The exception to this is when a joint is infected, and these cases are treated with antibiotics, if it is a bacterial infection. HOME CARE INSTRUCTIONS   Rest the injured area for as long as directed by your caregiver. Then slowly start using the joint as directed by your caregiver and as the pain allows. Crutches as directed may be useful if the ankles, knees or hips are involved. If the knee was splinted or casted, continue use and care as directed. If an stretchy or elastic wrapping bandage has been applied today, it should be removed and re-applied every 3 to 4 hours. It should not be applied tightly, but firmly enough to keep swelling down. Watch toes and feet for swelling, bluish discoloration, coldness, numbness or excessive pain. If any of these problems (symptoms) occur, remove the ace bandage and re-apply more loosely. If these symptoms persist, contact your caregiver or return to this location.   For the first 24 hours, keep the injured extremity elevated on pillows while lying down.   Apply ice for 15 to 20 minutes to the sore joint every couple hours while awake for the first half day. Then 3 to 4 times per day for the first 48 hours. Put the ice in a plastic bag and place a towel between the bag of ice and your skin.  Wear any splinting, casting, elastic bandage applications, or slings as instructed.   Only take over-the-counter or prescription medicines for pain, discomfort, or fever as directed by your caregiver. Do not use aspirin immediately after the injury unless instructed by your physician. Aspirin can cause increased bleeding and bruising of the tissues.   If you were given crutches, continue to use them as instructed and do not resume weight bearing on the sore joint until instructed.  Persistent pain and inability to use the  sore joint as directed for more than 2 to 3 days are warning signs indicating that you should see a caregiver for a follow-up visit as soon as possible. Initially, a hairline fracture (break in bone) may not be evident on X-rays. Persistent pain and swelling indicate that further evaluation, non-weight bearing or use of the joint (use of crutches or slings as instructed), or further X-rays are indicated. X-rays may sometimes not show a small fracture until a week or 10 days later. Make a follow-up appointment with your own caregiver or one to whom we have referred you. A radiologist (specialist in reading X-rays) may read your X-rays. Make sure you know how you are to obtain your X-ray results. Do not assume everything is normal if you do not hear from [place]. SEEK MEDICAL CARE IF: Bruising, swelling, or pain increases. SEEK IMMEDIATE MEDICAL CARE IF:   Your fingers or toes are numb or blue.   The pain is not responding to medications and continues to stay the same or get worse.   The pain in your joint becomes severe.   You develop a fever over 102 F (38.9 C).   It becomes impossible to move or use the joint.  MAKE SURE YOU:   Understand these instructions.   Will watch your condition.   Will get help right away if you are not doing well or get worse.  Document Released: 05/06/2005 Document Revised: 04/25/2011 Document Reviewed: 12/23/2007 [facility] Patient Information 2012 [place], [place].Neuropathy Neuropathy means your peripheral nerves are not working normally. Peripheral nerves are the nerves outside the brain and spinal cord. Messages between the brain and the rest of the body do not work properly with peripheral nerve disorders. CAUSES There are many different causes of peripheral nerve disorders. These include:  Injury.   Infections.   Diabetes.   Vitamin deficiency.   Poor circulation.   Alcoholism.   Exposure to toxins.   Drug effects.   Tumors.   Kidney disease.   SYMPTOMS  Tingling, burning, pain, and numbness in the extremities.   Weakness and loss of muscle tone and size.  DIAGNOSIS Blood tests and special studies of nerve function may help confirm the diagnosis.  TREATMENT  Treatment includes adopting healthy life habits.   A good diet, vitamin supplements, and mild pain medicine may be needed.   Avoid known toxins such as alcohol, tobacco, and recreational drugs.   Anti-convulsant medicines are helpful in some types of neuropathy.  Make a follow-up appointment with your caregiver to be sure you are getting better with treatment.  SEEK IMMEDIATE MEDICAL CARE IF:   You have breathing problems.   You have severe or uncontrolled pain.   You notice extreme weakness or you feel faint.   You are not better after 1 week or if you have worse symptoms.  Document Released: 06/13/2004 Document Revised: 01/16/2011 Document Reviewed: 05/06/2005 [facility] Patient Information 2012 [place], [place].Paresthesia Paresthesia is an abnormal burning or prickling sensation. This sensation is generally felt  in the hands, arms, legs, or feet. However, it may occur in any part of the body. It is usually not painful. The feeling may be described as:  Tingling or numbness.   "Pins and needles."   Skin crawling.   Buzzing.   Limbs "falling asleep."   Itching.  Most people experience temporary (transient) paresthesia at some time in their lives. CAUSES  Paresthesia may occur when you breathe too quickly (hyperventilation). It can also occur without any apparent cause. Commonly, paresthesia occurs when pressure is placed on a nerve. The feeling quickly goes away once the pressure is removed. For some people, however, paresthesia is a long-lasting (chronic) condition caused by an underlying disorder. The underlying disorder may be:  A traumatic, direct injury to nerves. Examples include a:   Broken (fractured) neck.   Fractured skull.   A disorder  affecting the brain and spinal cord (central nervous system). Examples include:   Transverse myelitis.   Encephalitis.   Transient ischemic attack.   Multiple sclerosis.   Stroke.   Tumor or blood vessel problems, such as an arteriovenous malformation pressing against the brain or spinal cord.   A condition that damages the peripheral nerves (peripheral neuropathy). Peripheral nerves are not part of the brain and spinal cord. These conditions include:   Diabetes.   Peripheral vascular disease.   Nerve entrapment syndromes, such as carpal tunnel syndrome.   Shingles.   Hypothyroidism.   Vitamin B12 deficiencies.   Alcoholism.   Heavy metal poisoning (lead, arsenic).   Rheumatoid arthritis.   Systemic lupus erythematosus.  DIAGNOSIS  Your caregiver will attempt to find the underlying cause of your paresthesia. Your caregiver may:  Take your medical history.   Perform a physical exam.   Order various lab tests.   Order imaging tests.  TREATMENT  Treatment for paresthesia depends on the underlying cause. HOME CARE INSTRUCTIONS  Avoid drinking alcohol.   You may consider massage or acupuncture to help relieve your symptoms.   Keep all follow-up appointments as directed by your caregiver.  SEEK IMMEDIATE MEDICAL CARE IF:   You feel weak.   You have trouble walking or moving.   You have problems with speech or vision.   You feel confused.   You cannot control your bladder or bowel movements.   You feel numbness after an injury.   You faint.   Your burning or prickling feeling gets worse when walking.   You have pain, cramps, or dizziness.   You develop a rash.  MAKE SURE YOU:  Understand these instructions.   Will watch your condition.   Will get help right away if you are not doing well or get worse.  Document Released: 04/26/2002 Document Revised: 04/25/2011 Document Reviewed: 01/25/2011 [facility] Patient Information 2012 [place], [place].

## 2011-08-01 NOTE — ED Notes (Signed)
Patient ambulatory with steady gait. Respirations equal and unlabored. Skin warm and dry. No acute distress noted. 

## 2011-08-02 NOTE — ED Provider Notes (Signed)
Medical screening examination/treatment/procedure(s) were performed by non-physician practitioner and as supervising physician I was immediately available for consultation/collaboration.   Lyanne Co, MD 08/02/11 1345

## 2011-11-11 ENCOUNTER — Encounter (HOSPITAL_COMMUNITY): Payer: Self-pay | Admitting: *Deleted

## 2011-11-11 ENCOUNTER — Emergency Department (HOSPITAL_COMMUNITY)
Admission: EM | Admit: 2011-11-11 | Discharge: 2011-11-12 | Disposition: A | Discharge status: Other Acute Inpatient Hospital | Payer: Self-pay | Attending: Emergency Medicine | Admitting: Emergency Medicine

## 2011-11-11 DIAGNOSIS — Z0389 Encounter for observation for other suspected diseases and conditions ruled out: Secondary | ICD-10-CM | POA: Insufficient documentation

## 2011-11-11 LAB — DIFFERENTIAL
Basophils Absolute: 0 10*3/uL (ref 0.0–0.1)
Basophils Relative: 1 % (ref 0–1)
Lymphocytes Relative: 33 % (ref 12–46)
Neutro Abs: 3.9 10*3/uL (ref 1.7–7.7)

## 2011-11-11 LAB — URINALYSIS, ROUTINE W REFLEX MICROSCOPIC
Leukocytes, UA: NEGATIVE
Nitrite: NEGATIVE
Protein, ur: NEGATIVE mg/dL
Specific Gravity, Urine: 1.02 (ref 1.005–1.030)
Urobilinogen, UA: 0.2 mg/dL (ref 0.0–1.0)

## 2011-11-11 LAB — CBC
MCHC: 34.4 g/dL (ref 30.0–36.0)
Platelets: 189 10*3/uL (ref 150–400)
RDW: 12.7 % (ref 11.5–15.5)
WBC: 7 10*3/uL (ref 4.0–10.5)

## 2011-11-11 LAB — COMPREHENSIVE METABOLIC PANEL
ALT: 11 U/L (ref 0–35)
AST: 15 U/L (ref 0–37)
Albumin: 4.3 g/dL (ref 3.5–5.2)
CO2: 26 mEq/L (ref 19–32)
Calcium: 9.7 mg/dL (ref 8.4–10.5)
Chloride: 103 mEq/L (ref 96–112)
Creatinine, Ser: 0.78 mg/dL (ref 0.50–1.10)
GFR calc non Af Amer: >90 mL/min (ref >90)
Sodium: 140 mEq/L (ref 135–145)
Total Bilirubin: 0.6 mg/dL (ref 0.3–1.2)

## 2011-11-11 LAB — PREGNANCY, URINE: Preg Test, Ur: NEGATIVE

## 2011-11-11 NOTE — ED Notes (Signed)
Headache pelvic pain neck.  No appetite  Nausea abd cramps. .  Neck pain and some intermittent  Chest pain

## 2011-11-12 ENCOUNTER — Encounter (HOSPITAL_BASED_OUTPATIENT_CLINIC_OR_DEPARTMENT_OTHER): Payer: Self-pay | Admitting: *Deleted

## 2011-11-12 ENCOUNTER — Emergency Department (HOSPITAL_BASED_OUTPATIENT_CLINIC_OR_DEPARTMENT_OTHER)
Admission: EM | Admit: 2011-11-12 | Discharge: 2011-11-12 | Disposition: A | Discharge status: Home / Self Care | Payer: Self-pay | Attending: Emergency Medicine | Admitting: Emergency Medicine

## 2011-11-12 ENCOUNTER — Other Ambulatory Visit (HOSPITAL_BASED_OUTPATIENT_CLINIC_OR_DEPARTMENT_OTHER): Payer: Self-pay

## 2011-11-12 ENCOUNTER — Other Ambulatory Visit (HOSPITAL_BASED_OUTPATIENT_CLINIC_OR_DEPARTMENT_OTHER): Payer: Self-pay | Admitting: Emergency Medicine

## 2011-11-12 DIAGNOSIS — M542 Cervicalgia: Secondary | ICD-10-CM | POA: Insufficient documentation

## 2011-11-12 DIAGNOSIS — A499 Bacterial infection, unspecified: Secondary | ICD-10-CM | POA: Insufficient documentation

## 2011-11-12 DIAGNOSIS — B9689 Other specified bacterial agents as the cause of diseases classified elsewhere: Secondary | ICD-10-CM | POA: Insufficient documentation

## 2011-11-12 DIAGNOSIS — R52 Pain, unspecified: Secondary | ICD-10-CM

## 2011-11-12 DIAGNOSIS — R102 Pelvic and perineal pain: Secondary | ICD-10-CM

## 2011-11-12 DIAGNOSIS — N760 Acute vaginitis: Secondary | ICD-10-CM | POA: Insufficient documentation

## 2011-11-12 DIAGNOSIS — Z888 Allergy status to other drugs, medicaments and biological substances status: Secondary | ICD-10-CM | POA: Insufficient documentation

## 2011-11-12 DIAGNOSIS — N949 Unspecified condition associated with female genital organs and menstrual cycle: Secondary | ICD-10-CM | POA: Insufficient documentation

## 2011-11-12 DIAGNOSIS — F17200 Nicotine dependence, unspecified, uncomplicated: Secondary | ICD-10-CM | POA: Insufficient documentation

## 2011-11-12 HISTORY — DX: Renal tubulo-interstitial disease, unspecified: N15.9

## 2011-11-12 LAB — WET PREP, GENITAL: Trich, Wet Prep: NONE SEEN

## 2011-11-12 LAB — GC/CHLAMYDIA PROBE AMP, GENITAL
Chlamydia, DNA Probe: NEGATIVE
GC Probe Amp, Genital: NEGATIVE

## 2011-11-12 MED ORDER — OXYCODONE-ACETAMINOPHEN 5-325 MG PO TABS: 1.0000 | ORAL_TABLET | Freq: Four times a day (QID) | ORAL | Status: AC | PRN

## 2011-11-12 MED ORDER — METRONIDAZOLE 500 MG PO TABS: 500.0000 mg | ORAL_TABLET | Freq: Two times a day (BID) | ORAL | Status: AC

## 2011-11-12 NOTE — Discharge Instructions (Signed)
Bacterial Vaginosis Bacterial vaginosis is an infection of the vagina. A healthy vagina has many kinds of good germs (bacteria). Sometimes the number of good germs can change. This allows bad germs to move in and cause an infection. You may be given medicine (antibiotics) to treat the infection. Or, you may not need treatment at all. HOME CARE  Take your medicine as told. Finish them even if you start to feel better.   Do not have sex until you finish your medicine.   Do not douche.   Practice safe sex.   Tell your sex partner that you have an infection. They should see their doctor for treatment if they have problems.  GET HELP RIGHT AWAY IF:  You do not get better after 3 days of treatment.   You have grey fluid (discharge) coming from your vagina.   You have pain.   You have a temperature of 102 F (38.9 C) or higher.  MAKE SURE YOU:   Understand these instructions.   Will watch your condition.   Will get help right away if you are not doing well or get worse.  Document Released: 02/13/2008 Document Revised: 04/25/2011 Document Reviewed: 02/13/2008 [employer] Patient Information 2012 [employer]. 

## 2011-11-12 NOTE — ED Notes (Signed)
MD at bedside. 

## 2011-11-12 NOTE — ED Provider Notes (Signed)
History     CSN: [id number]  Arrival date & time 11/12/11  4098   First MD Initiated Contact with Patient 11/12/11 0117      Chief Complaint  Patient presents with  . Neck Pain    (Consider location/radiation/quality/duration/timing/severity/associated sxs/prior treatment) Patient is a 31 y.o. female presenting with neck pain. The history is provided by the patient.  Neck Pain  This is a new problem. Pertinent negatives include no chest pain, no numbness, no headaches and no weakness.   patient has had some neck pain radiating to right arm for last week. As dull and constant. No fevers. She's also had lower abdominal pain. Worse on the right lower abdomen. She states her period is heavier than normal. No vaginal discharge. No dysuria. She states she has frequent UTIs, this does not feel like that. No change in her appetite. The pain comes and goes. She's had a tubal ligation.  Past Medical History  Diagnosis Date  . Renal disorder     under active right kidney  . Kidney infection     Past Surgical History  Procedure Date  . Tubal ligation   . Breast enhancement surgery     History reviewed. No pertinent family history.  History  Substance Use Topics  . Smoking status: Current Everyday Smoker -- 0.5 packs/day  . Smokeless tobacco: Not on file  . Alcohol Use: Yes     rarely    OB History    Grav Para Term Preterm Abortions TAB SAB Ect Mult Living   4    1           Review of Systems  Constitutional: Negative for fever, chills, activity change and appetite change.  HENT: Positive for neck pain. Negative for neck stiffness.   Eyes: Negative for pain.  Respiratory: Negative for chest tightness and shortness of breath.   Cardiovascular: Negative for chest pain and leg swelling.  Gastrointestinal: Negative for nausea, vomiting, abdominal pain and diarrhea.  Genitourinary: Positive for pelvic pain. Negative for flank pain.  Musculoskeletal: Negative for back pain.    Skin: Negative for rash.  Neurological: Negative for weakness, numbness and headaches.  Psychiatric/Behavioral: Negative for behavioral problems.    Allergies  Flagyl  Home Medications   Current Outpatient Rx  Name Route Sig Dispense Refill  . ALBUTEROL SULFATE HFA 108 (90 BASE) MCG/ACT IN AERS Inhalation Inhale 2 puffs into the lungs every 6 (six) hours as needed. For emergency asthma symoms    . IBUPROFEN 200 MG PO TABS Oral Take 600 mg by mouth every 6 (six) hours as needed. For pain.     Marland Kitchen METRONIDAZOLE 500 MG PO TABS Oral Take 1 tablet (500 mg total) by mouth 2 (two) times daily. 14 tablet 0  . ONE-DAILY MULTI VITAMINS PO TABS Oral Take 1 tablet by mouth daily.    . OXYCODONE-ACETAMINOPHEN 5-325 MG PO TABS Oral Take 1-2 tablets by mouth every 6 (six) hours as needed for pain. 8 tablet 0  . PRO-BIOTIC BLEND PO Oral Take 1 tablet by mouth daily.      BP 110/75  Temp 97.8 F (36.6 C) (Oral)  Resp 15  SpO2 100%  LMP 11/03/2011  Physical Exam  Nursing note and vitals reviewed. Constitutional: She is oriented to person, place, and time. She appears well-developed and well-nourished.  HENT:  Head: Normocephalic and atraumatic.  Eyes: EOM are normal. Pupils are equal, round, and reactive to light.  Neck: Normal range of motion. Neck supple.  No meningeal signs. Tender over right trapezius.  Cardiovascular: Normal rate, regular rhythm and normal heart sounds.   No murmur heard. Pulmonary/Chest: Effort normal and breath sounds normal. No respiratory distress. She has no wheezes. She has no rales.  Abdominal: Soft. Bowel sounds are normal. She exhibits no distension. There is tenderness. There is no rebound and no guarding.       Mild suprapubic to right pubic tenderness. Suprapubic tattoo that says "danger"  Genitourinary: Vaginal discharge found.       Mild white vaginal discharge on pelvic exam without cervical motion tenderness or adnexal tenderness.  Musculoskeletal:  Normal range of motion.  Neurological: She is alert and oriented to person, place, and time. No cranial nerve deficit.  Skin: Skin is warm and dry. Rash noted.       Few scattered small red areas 1-2 mm across. Not raised.  Psychiatric: She has a normal mood and affect. Her speech is normal.    ED Course  Procedures (including critical care time)  Labs Reviewed  WET PREP, GENITAL - Abnormal; Notable for the following:    Clue Cells Wet Prep HPF POC FEW (*)     WBC, Wet Prep HPF POC MODERATE (*)  BACTERIA- TOO NUMEROUS TO COUNT   All other components within normal limits  GC/CHLAMYDIA PROBE AMP, GENITAL   No results found.   1. Pelvic pain   2. Bacterial vaginosis       MDM  Patient with pelvic pain and neck pain. Doubt severe intra-abdominal pathology at this time. There were clue cells and white cells on wet prep. Patient will be treated for BV. Patient will followup for an ultrasound tomorrow. GC and Chlamydia are pending        Juliet Rude. Rubin Payor, MD 11/12/11 [phone]

## 2011-11-12 NOTE — ED Notes (Signed)
Pt reports that last Monday her neck start hurting for no apparent reason. Pt reports that the pain is also radiating down her right arm. Pt denies any trauma.

## 2011-11-13 ENCOUNTER — Other Ambulatory Visit (HOSPITAL_BASED_OUTPATIENT_CLINIC_OR_DEPARTMENT_OTHER): Payer: Self-pay

## 2011-12-16 ENCOUNTER — Emergency Department (HOSPITAL_COMMUNITY)
Admission: EM | Admit: 2011-12-16 | Discharge: 2011-12-16 | Disposition: A | Discharge status: Home / Self Care | Payer: Self-pay | Attending: Emergency Medicine | Admitting: Emergency Medicine

## 2011-12-16 ENCOUNTER — Encounter (HOSPITAL_COMMUNITY): Payer: Self-pay | Admitting: Emergency Medicine

## 2011-12-16 DIAGNOSIS — J45909 Unspecified asthma, uncomplicated: Secondary | ICD-10-CM | POA: Insufficient documentation

## 2011-12-16 DIAGNOSIS — R000 Tachycardia, unspecified: Secondary | ICD-10-CM | POA: Insufficient documentation

## 2011-12-16 DIAGNOSIS — R42 Dizziness and giddiness: Secondary | ICD-10-CM | POA: Insufficient documentation

## 2011-12-16 DIAGNOSIS — F17200 Nicotine dependence, unspecified, uncomplicated: Secondary | ICD-10-CM | POA: Insufficient documentation

## 2011-12-16 HISTORY — DX: Unspecified asthma, uncomplicated: J45.909

## 2011-12-16 LAB — CBC WITH DIFFERENTIAL/PLATELET
Basophils Absolute: 0 10*3/uL (ref 0.0–0.1)
Basophils Relative: 1 % (ref 0–1)
MCHC: 35.3 g/dL (ref 30.0–36.0)
Monocytes Absolute: 0.5 10*3/uL (ref 0.1–1.0)
Neutro Abs: 4.2 10*3/uL (ref 1.7–7.7)
Neutrophils Relative %: 68 % (ref 43–77)
RDW: 12.5 % (ref 11.5–15.5)

## 2011-12-16 LAB — COMPREHENSIVE METABOLIC PANEL
Albumin: 4.8 g/dL (ref 3.5–5.2)
Alkaline Phosphatase: 43 U/L (ref 39–117)
BUN: 12 mg/dL (ref 6–23)
Calcium: 9.8 mg/dL (ref 8.4–10.5)
Potassium: 4.2 mEq/L (ref 3.5–5.1)
Total Protein: 7.6 g/dL (ref 6.0–8.3)

## 2011-12-16 LAB — URINALYSIS, ROUTINE W REFLEX MICROSCOPIC
Bilirubin Urine: NEGATIVE
Nitrite: NEGATIVE
Protein, ur: NEGATIVE mg/dL
Specific Gravity, Urine: 1.013 (ref 1.005–1.030)
Urobilinogen, UA: 0.2 mg/dL (ref 0.0–1.0)

## 2011-12-16 MED ORDER — SODIUM CHLORIDE 0.9 % IV BOLUS (SEPSIS)
1000.0000 mL | Freq: Once | INTRAVENOUS | Status: AC
Start: 2011-12-16 — End: 2011-12-16
  Administered 2011-12-16: 1000 mL via INTRAVENOUS

## 2011-12-16 NOTE — ED Notes (Signed)
Family at bedside. 

## 2011-12-16 NOTE — ED Notes (Signed)
Pt presenting to ed with c/p bodyaches, dizziness, nightsweats and weight loss. Pt states she had Tb test about 3months ago that was negative. Pt states she is also having a lot of joint pain. Pt states positive abdominal pain. Pt denies nausea, vomiting and diarrhea, pt states head pressure with ringing in her ears, sensitivity to light. Pt with multiple complaints and this time. Pt denies chest pain at this time

## 2011-12-16 NOTE — ED Provider Notes (Addendum)
History     CSN: [id number]  Arrival date & time 12/16/11  1435   First MD Initiated Contact with Patient 12/16/11 1520      Chief Complaint  Patient presents with  . Dizziness    (Consider location/radiation/quality/duration/timing/severity/associated sxs/prior treatment) The history is provided by the patient and medical records.   31 year old, female, with no significant past medical history.  He smokes cigarettes, presents emergency department complaining of a bandlike headache, associated with nausea, and dizziness.  She also says that she has muscle fasciculations.  Myalgias, and weight loss.  She has a nonproductive cough.  She denies fevers, or chills.  She says that she has had sweating, and muscle cramps.  She has increased fatigue, and hair loss.  She denies urinary tract symptoms, or diarrhea.  Past Medical History  Diagnosis Date  . Renal disorder     under active right kidney  . Kidney infection   . Asthma     Past Surgical History  Procedure Date  . Tubal ligation   . Breast enhancement surgery     No family history on file.  History  Substance Use Topics  . Smoking status: Current Everyday Smoker -- 0.5 packs/day  . Smokeless tobacco: Not on file  . Alcohol Use: Yes     rarely    OB History    Grav Para Term Preterm Abortions TAB SAB Ect Mult Living   4    1           Review of Systems  Constitutional: Positive for diaphoresis, fatigue and unexpected weight change. Negative for fever and chills.  HENT: Positive for neck pain. Negative for congestion, sore throat, trouble swallowing and neck stiffness.   Eyes: Negative for visual disturbance.  Respiratory: Positive for cough. Negative for shortness of breath.   Cardiovascular: Negative for chest pain.  Gastrointestinal: Positive for nausea. Negative for vomiting, abdominal pain and diarrhea.  Genitourinary: Negative for dysuria and hematuria.  Musculoskeletal: Negative for back pain.   Muscle cramps  Skin:       She is "red dots on her skin  Neurological: Positive for headaches.  Hematological: Negative for adenopathy. Does not bruise/bleed easily.  Psychiatric/Behavioral: Negative for confusion.  All other systems reviewed and are negative.    Allergies  Flagyl  Home Medications   Current Outpatient Rx  Name Route Sig Dispense Refill  . ACETAMINOPHEN 500 MG PO TABS Oral Take 500 mg by mouth every 6 (six) hours as needed. pain    . ALBUTEROL SULFATE HFA 108 (90 BASE) MCG/ACT IN AERS Inhalation Inhale 2 puffs into the lungs every 6 (six) hours as needed. For emergency asthma symoms    . PRO-BIOTIC BLEND PO Oral Take 1 tablet by mouth daily.      BP 133/90  Pulse 115  Temp 99 F (37.2 C) (Oral)  Resp 18  Ht 5\' 2"  (1.575 m)  Wt 104 lb 6.4 oz (47.356 kg)  BMI 19.10 kg/m2  SpO2 99%  LMP 12/07/2011  Physical Exam  Vitals reviewed. Constitutional: She is oriented to person, place, and time. She appears well-developed and well-nourished. No distress.  HENT:  Head: Normocephalic and atraumatic.  Eyes: Conjunctivae are normal.  Neck: Normal range of motion. Neck supple. No JVD present. No thyromegaly present.  Cardiovascular: Regular rhythm.   No murmur heard.      Tachycardia   Pulmonary/Chest: Effort normal and breath sounds normal. No respiratory distress.  Abdominal: Soft. She exhibits no distension. There  is no tenderness.  Musculoskeletal: Normal range of motion. She exhibits no edema and no tenderness.  Neurological: She is alert and oriented to person, place, and time.  Skin: Skin is warm and dry. No rash noted. No erythema.  Psychiatric: She has a normal mood and affect. Thought content normal.    ED Course  Procedures (including critical care time)   Labs Reviewed  CBC WITH DIFFERENTIAL  URINALYSIS, ROUTINE W REFLEX MICROSCOPIC  COMPREHENSIVE METABOLIC PANEL   No results found.   No diagnosis found.  Resting comfortably.   sxs and  signs suggest hyperthyroidism.   Not in storm.  Will refer to IM for further eval.  Pt does not have PCP.  MDM  Tachycardia with wt loss, hair loss, sweating and mild temp increase        Cheri Guppy, MD 12/16/11 1550  Cheri Guppy, MD 12/16/11 1736

## 2011-12-16 NOTE — ED Notes (Signed)
NS bolus finished, pt sts she feels the same as when she came into the ED, still c/o of joint pain

## 2011-12-17 LAB — TSH: TSH: 0.786 u[IU]/mL (ref 0.350–4.500)

## 2012-12-11 ENCOUNTER — Emergency Department (HOSPITAL_BASED_OUTPATIENT_CLINIC_OR_DEPARTMENT_OTHER)
Admission: EM | Admit: 2012-12-11 | Discharge: 2012-12-12 | Disposition: A | Discharge status: Home / Self Care | Payer: Self-pay | Attending: Emergency Medicine | Admitting: Emergency Medicine

## 2012-12-11 ENCOUNTER — Encounter (HOSPITAL_BASED_OUTPATIENT_CLINIC_OR_DEPARTMENT_OTHER): Payer: Self-pay | Admitting: *Deleted

## 2012-12-11 DIAGNOSIS — Z3202 Encounter for pregnancy test, result negative: Secondary | ICD-10-CM | POA: Insufficient documentation

## 2012-12-11 DIAGNOSIS — Y929 Unspecified place or not applicable: Secondary | ICD-10-CM | POA: Insufficient documentation

## 2012-12-11 DIAGNOSIS — Z87448 Personal history of other diseases of urinary system: Secondary | ICD-10-CM | POA: Insufficient documentation

## 2012-12-11 DIAGNOSIS — S61409A Unspecified open wound of unspecified hand, initial encounter: Secondary | ICD-10-CM | POA: Insufficient documentation

## 2012-12-11 DIAGNOSIS — Z79899 Other long term (current) drug therapy: Secondary | ICD-10-CM | POA: Insufficient documentation

## 2012-12-11 DIAGNOSIS — Y939 Activity, unspecified: Secondary | ICD-10-CM | POA: Insufficient documentation

## 2012-12-11 DIAGNOSIS — F17200 Nicotine dependence, unspecified, uncomplicated: Secondary | ICD-10-CM | POA: Insufficient documentation

## 2012-12-11 DIAGNOSIS — R630 Anorexia: Secondary | ICD-10-CM | POA: Insufficient documentation

## 2012-12-11 DIAGNOSIS — N390 Urinary tract infection, site not specified: Secondary | ICD-10-CM

## 2012-12-11 DIAGNOSIS — IMO0001 Reserved for inherently not codable concepts without codable children: Secondary | ICD-10-CM | POA: Insufficient documentation

## 2012-12-11 DIAGNOSIS — R6883 Chills (without fever): Secondary | ICD-10-CM | POA: Insufficient documentation

## 2012-12-11 DIAGNOSIS — W5501XA Bitten by cat, initial encounter: Secondary | ICD-10-CM

## 2012-12-11 DIAGNOSIS — J45909 Unspecified asthma, uncomplicated: Secondary | ICD-10-CM | POA: Insufficient documentation

## 2012-12-11 DIAGNOSIS — R5381 Other malaise: Secondary | ICD-10-CM | POA: Insufficient documentation

## 2012-12-11 LAB — URINALYSIS, ROUTINE W REFLEX MICROSCOPIC
Glucose, UA: NEGATIVE mg/dL
Ketones, ur: 15 mg/dL — AB
Protein, ur: 30 mg/dL — AB
Urobilinogen, UA: 0.2 mg/dL (ref 0.0–1.0)

## 2012-12-11 LAB — COMPREHENSIVE METABOLIC PANEL
Alkaline Phosphatase: 38 U/L — ABNORMAL LOW (ref 39–117)
BUN: 13 mg/dL (ref 6–23)
CO2: 27 mEq/L (ref 19–32)
Chloride: 106 mEq/L (ref 96–112)
Creatinine, Ser: 0.9 mg/dL (ref 0.50–1.10)
GFR calc non Af Amer: 84 mL/min — ABNORMAL LOW (ref >90)
Potassium: 3.3 mEq/L — ABNORMAL LOW (ref 3.5–5.1)
Total Bilirubin: 0.6 mg/dL (ref 0.3–1.2)

## 2012-12-11 LAB — URINE MICROSCOPIC-ADD ON

## 2012-12-11 LAB — CBC WITH DIFFERENTIAL/PLATELET
HCT: 40.8 % (ref 36.0–46.0)
Hemoglobin: 14.1 g/dL (ref 12.0–15.0)
Lymphocytes Relative: 25 % (ref 12–46)
Lymphs Abs: 1.8 10*3/uL (ref 0.7–4.0)
MCHC: 34.6 g/dL (ref 30.0–36.0)
Monocytes Absolute: 0.9 10*3/uL (ref 0.1–1.0)
Monocytes Relative: 13 % — ABNORMAL HIGH (ref 3–12)
Neutro Abs: 4.2 10*3/uL (ref 1.7–7.7)
Neutrophils Relative %: 60 % (ref 43–77)
RBC: 4.44 MIL/uL (ref 3.87–5.11)
WBC: 7 10*3/uL (ref 4.0–10.5)

## 2012-12-11 LAB — HCG, QUANTITATIVE, PREGNANCY: hCG, Beta Chain, Quant, S: <1 m[IU]/mL (ref <5)

## 2012-12-11 MED ORDER — RABIES VACCINE, PCEC IM SUSR
1.0000 mL | Freq: Once | INTRAMUSCULAR | Status: AC
  Administered 2012-12-11: 1 mL via INTRAMUSCULAR
  Filled 2012-12-11: qty 1

## 2012-12-11 MED ORDER — SODIUM CHLORIDE 0.9 % IV SOLN
Freq: Once | INTRAVENOUS | Status: AC
  Administered 2012-12-11: 23:00:00 via INTRAVENOUS

## 2012-12-11 MED ORDER — RABIES IMMUNE GLOBULIN 150 UNIT/ML IM INJ
20.0000 [IU]/kg | INJECTION | Freq: Once | INTRAMUSCULAR | Status: AC
  Administered 2012-12-11: 900 [IU] via INTRAMUSCULAR
  Filled 2012-12-11 (×2): qty 6

## 2012-12-11 NOTE — ED Notes (Signed)
Pt c/o cat bite x 10 days ago from stray cat, weakness, decreased appetite and dizziness x 2 days. Animal control has cat

## 2012-12-12 MED ORDER — CIPROFLOXACIN HCL 500 MG PO TABS: 500.0000 mg | ORAL_TABLET | Freq: Two times a day (BID) | ORAL | Status: DC

## 2012-12-12 NOTE — ED Provider Notes (Signed)
CSN: [id number]     Arrival date & time 12/11/12  2036 History     First MD Initiated Contact with Patient 12/11/12 2257     Chief Complaint  Patient presents with  . Animal Bite   (Consider location/radiation/quality/duration/timing/severity/associated sxs/prior Treatment) HPI Comments: Two weeks ago, the patient and her significant other took in a stray kitten they found wandering in [place].  The cat initially seemed okay and was very affectionate.  Shortly afterward, the cat became aggressive and not acting right.  10 days ago, the cat bit her on the hand.  For the past three days, the cat has appeared confused and is acting sick.  Animal control was called and the cat was taken by them.  The patient now presents with complaints of feeling nauseated, headache, dizzy, and weak to the point she says she needs her significant other to help hold her up.    Patient is a 32 y.o. female presenting with animal bite. The history is provided by the patient.  Animal Bite Contact animal:  Cat Location:  Hand Time since incident:  10 days Pain details:    Severity:  Mild   Timing:  Constant   Progression:  Worsening Associated symptoms: no fever     Past Medical History  Diagnosis Date  . Renal disorder     under active right kidney  . Kidney infection   . Asthma    Past Surgical History  Procedure Laterality Date  . Tubal ligation    . Breast enhancement surgery     History reviewed. No pertinent family history. History  Substance Use Topics  . Smoking status: Current Every Day Smoker -- 0.50 packs/day    Types: Cigarettes  . Smokeless tobacco: Not on file  . Alcohol Use: Yes     Comment: rarely   OB History   Grav Para Term Preterm Abortions TAB SAB Ect Mult Living   4    1          Review of Systems  Constitutional: Positive for chills, activity change, appetite change and fatigue. Negative for fever.  Neurological: Positive for weakness and headaches.  All other  systems reviewed and are negative.    Allergies  Flagyl  Home Medications   Current Outpatient Rx  Name  Route  Sig  Dispense  Refill  . acetaminophen (TYLENOL) 500 MG tablet   Oral   Take 500 mg by mouth every 6 (six) hours as needed. pain         . albuterol (PROVENTIL HFA;VENTOLIN HFA) 108 (90 BASE) MCG/ACT inhaler   Inhalation   Inhale 2 puffs into the lungs every 6 (six) hours as needed. For emergency asthma symoms         . Probiotic Product (PRO-BIOTIC BLEND PO)   Oral   Take 1 tablet by mouth daily.          BP 134/88  Pulse 81  Temp(Src) 98.4 F (36.9 C) (Oral)  Resp 16  Ht 5\' 2"  (1.575 m)  Wt 101 lb (45.813 kg)  BMI 18.47 kg/m2  SpO2 100%  LMP 11/06/2012 Physical Exam  Nursing note and vitals reviewed. Constitutional: She is oriented to person, place, and time. She appears well-developed and well-nourished. No distress.  HENT:  Head: Normocephalic and atraumatic.  Neck: Normal range of motion. Neck supple.  Cardiovascular: Normal rate and regular rhythm.  Exam reveals no gallop and no friction rub.   No murmur heard. Pulmonary/Chest: Effort normal and  breath sounds normal. No respiratory distress. She has no wheezes.  Abdominal: Soft. Bowel sounds are normal. She exhibits no distension. There is no tenderness.  Musculoskeletal: Normal range of motion.  Neurological: She is alert and oriented to person, place, and time. No cranial nerve deficit. She exhibits normal muscle tone. Coordination normal.  Skin: Skin is warm and dry. She is not diaphoretic.    ED Course   Procedures (including critical care time)  Labs Reviewed  CBC WITH DIFFERENTIAL - Abnormal; Notable for the following:    Platelets 143 (*)    Monocytes Relative 13 (*)    All other components within normal limits  COMPREHENSIVE METABOLIC PANEL - Abnormal; Notable for the following:    Potassium 3.3 (*)    Glucose, Bld 105 (*)    Alkaline Phosphatase 38 (*)    GFR calc non Af  Amer 84 (*)    All other components within normal limits  URINALYSIS, ROUTINE W REFLEX MICROSCOPIC - Abnormal; Notable for the following:    APPearance CLOUDY (*)    Hgb urine dipstick TRACE (*)    Ketones, ur 15 (*)    Protein, ur 30 (*)    Nitrite POSITIVE (*)    Leukocytes, UA TRACE (*)    All other components within normal limits  URINE MICROSCOPIC-ADD ON - Abnormal; Notable for the following:    Squamous Epithelial / LPF MANY (*)    Bacteria, UA MANY (*)    All other components within normal limits  HCG, QUANTITATIVE, PREGNANCY   No results found. No diagnosis found.  MDM  The labs are unremarkable with the exception of a uti.  I have decided to initiate the rabies vaccine protocol due the situation and symptoms pending the results of the quarantine by Animal [employer].  Will treat with cipro for uti, to return prn.  Geoffery Lyons, MD 12/12/12 [phone]

## 2012-12-14 ENCOUNTER — Emergency Department (INDEPENDENT_AMBULATORY_CARE_PROVIDER_SITE_OTHER)
Admission: EM | Admit: 2012-12-14 | Discharge: 2012-12-14 | Disposition: A | Discharge status: Home / Self Care | Payer: Self-pay | Source: Home / Self Care | Attending: Family Medicine | Admitting: Family Medicine

## 2012-12-14 DIAGNOSIS — Z203 Contact with and (suspected) exposure to rabies: Secondary | ICD-10-CM

## 2012-12-14 DIAGNOSIS — F410 Panic disorder [episodic paroxysmal anxiety] without agoraphobia: Secondary | ICD-10-CM

## 2012-12-14 MED ORDER — RABIES VACCINE, PCEC IM SUSR
INTRAMUSCULAR | Status: AC
  Filled 2012-12-14: qty 1

## 2012-12-14 MED ORDER — RABIES VACCINE, PCEC IM SUSR
1.0000 mL | Freq: Once | INTRAMUSCULAR | Status: AC
  Administered 2012-12-14: 1 mL via INTRAMUSCULAR

## 2012-12-14 NOTE — ED Notes (Addendum)
Patient states she has some nausea and hallucinations ever since.   Patient thinks she is allergic to the rabies vaccination.  Patient states she is dizziness

## 2012-12-14 NOTE — ED Provider Notes (Signed)
  CSN: [id number]     Arrival date & time 12/14/12  1739 History     First MD Initiated Contact with Patient 12/14/12 1907     Chief Complaint  Patient presents with  . Rabies Injection  . Nausea   (Consider location/radiation/quality/duration/timing/severity/associated sxs/prior Treatment) Patient is a 32 y.o. female presenting with general illness. The history is provided by the patient.  Illness Severity:  Mild Progression:  Unchanged Chronicity:  New Context:  Onset of brief haaluc, nausea, dizziness , joint achessince 7/25 rabies initial inject for stray kitten exposure Associated symptoms: myalgias and nausea     Past Medical History  Diagnosis Date  . Renal disorder     under active right kidney  . Kidney infection   . Asthma    Past Surgical History  Procedure Laterality Date  . Tubal ligation    . Breast enhancement surgery     No family history on file. History  Substance Use Topics  . Smoking status: Current Every Day Smoker -- 0.50 packs/day    Types: Cigarettes  . Smokeless tobacco: Not on file  . Alcohol Use: Yes     Comment: rarely   OB History   Grav Para Term Preterm Abortions TAB SAB Ect Mult Living   4    1          Review of Systems  Gastrointestinal: Positive for nausea.  Musculoskeletal: Positive for myalgias.    Allergies  Flagyl  Home Medications   Current Outpatient Rx  Name  Route  Sig  Dispense  Refill  . acetaminophen (TYLENOL) 500 MG tablet   Oral   Take 500 mg by mouth every 6 (six) hours as needed. pain         . albuterol (PROVENTIL HFA;VENTOLIN HFA) 108 (90 BASE) MCG/ACT inhaler   Inhalation   Inhale 2 puffs into the lungs every 6 (six) hours as needed. For emergency asthma symoms         . ciprofloxacin (CIPRO) 500 MG tablet   Oral   Take 1 tablet (500 mg total) by mouth 2 (two) times daily.   10 tablet   0   . Probiotic Product (PRO-BIOTIC BLEND PO)   Oral   Take 1 tablet by mouth daily.          BP  126/85  Pulse 76  Temp(Src) 98.9 F (37.2 C) (Oral)  Resp 16  SpO2 96%  LMP 11/06/2012 Physical Exam  Nursing note and vitals reviewed. Constitutional: She is oriented to person, place, and time. She appears well-developed and well-nourished. No distress.  Eyes: EOM are normal. Pupils are equal, round, and reactive to light.  Neck: Normal range of motion. Neck supple.  Lymphadenopathy:    She has no cervical adenopathy.  Neurological: She is alert and oriented to person, place, and time. No cranial nerve deficit.  Skin: Skin is warm and dry.  Psychiatric: She has a normal mood and affect. Her behavior is normal. Judgment and thought content normal.    ED Course   Procedures (including critical care time)  Labs Reviewed - No data to display No results found. 1. Anxiety attack     MDM    Linna Hoff, MD 12/14/12 [phone]

## 2012-12-16 ENCOUNTER — Encounter (HOSPITAL_BASED_OUTPATIENT_CLINIC_OR_DEPARTMENT_OTHER): Payer: Self-pay | Admitting: Emergency Medicine

## 2012-12-16 ENCOUNTER — Emergency Department (HOSPITAL_BASED_OUTPATIENT_CLINIC_OR_DEPARTMENT_OTHER)
Admission: EM | Admit: 2012-12-16 | Discharge: 2012-12-16 | Disposition: A | Discharge status: Home / Self Care | Payer: Self-pay | Attending: Emergency Medicine | Admitting: Emergency Medicine

## 2012-12-16 DIAGNOSIS — Z87448 Personal history of other diseases of urinary system: Secondary | ICD-10-CM | POA: Insufficient documentation

## 2012-12-16 DIAGNOSIS — R42 Dizziness and giddiness: Secondary | ICD-10-CM | POA: Insufficient documentation

## 2012-12-16 DIAGNOSIS — Z79899 Other long term (current) drug therapy: Secondary | ICD-10-CM | POA: Insufficient documentation

## 2012-12-16 DIAGNOSIS — J45909 Unspecified asthma, uncomplicated: Secondary | ICD-10-CM | POA: Insufficient documentation

## 2012-12-16 DIAGNOSIS — R51 Headache: Secondary | ICD-10-CM | POA: Insufficient documentation

## 2012-12-16 DIAGNOSIS — F17200 Nicotine dependence, unspecified, uncomplicated: Secondary | ICD-10-CM | POA: Insufficient documentation

## 2012-12-16 DIAGNOSIS — R109 Unspecified abdominal pain: Secondary | ICD-10-CM | POA: Insufficient documentation

## 2012-12-16 DIAGNOSIS — T50B95A Adverse effect of other viral vaccines, initial encounter: Secondary | ICD-10-CM | POA: Insufficient documentation

## 2012-12-16 DIAGNOSIS — T888XXA Other specified complications of surgical and medical care, not elsewhere classified, initial encounter: Secondary | ICD-10-CM | POA: Insufficient documentation

## 2012-12-16 DIAGNOSIS — R0602 Shortness of breath: Secondary | ICD-10-CM | POA: Insufficient documentation

## 2012-12-16 DIAGNOSIS — R209 Unspecified disturbances of skin sensation: Secondary | ICD-10-CM | POA: Insufficient documentation

## 2012-12-16 DIAGNOSIS — N289 Disorder of kidney and ureter, unspecified: Secondary | ICD-10-CM | POA: Insufficient documentation

## 2012-12-16 LAB — COMPREHENSIVE METABOLIC PANEL
Alkaline Phosphatase: 32 U/L — ABNORMAL LOW (ref 39–117)
BUN: 12 mg/dL (ref 6–23)
CO2: 25 mEq/L (ref 19–32)
Chloride: 104 mEq/L (ref 96–112)
Creatinine, Ser: 0.8 mg/dL (ref 0.50–1.10)
GFR calc Af Amer: >90 mL/min (ref >90)
GFR calc non Af Amer: >90 mL/min (ref >90)
Glucose, Bld: 83 mg/dL (ref 70–99)
Potassium: 3.4 mEq/L — ABNORMAL LOW (ref 3.5–5.1)
Total Bilirubin: 0.9 mg/dL (ref 0.3–1.2)

## 2012-12-16 LAB — CBC WITH DIFFERENTIAL/PLATELET
HCT: 40.3 % (ref 36.0–46.0)
Hemoglobin: 13.9 g/dL (ref 12.0–15.0)
Lymphs Abs: 1 10*3/uL (ref 0.7–4.0)
MCHC: 34.5 g/dL (ref 30.0–36.0)
Monocytes Absolute: 0.5 10*3/uL (ref 0.1–1.0)
Monocytes Relative: 14 % — ABNORMAL HIGH (ref 3–12)
Neutro Abs: 2.2 10*3/uL (ref 1.7–7.7)
Neutrophils Relative %: 59 % (ref 43–77)
RBC: 4.44 MIL/uL (ref 3.87–5.11)

## 2012-12-16 LAB — URINALYSIS, ROUTINE W REFLEX MICROSCOPIC
Glucose, UA: NEGATIVE mg/dL
Ketones, ur: 15 mg/dL — AB
Leukocytes, UA: NEGATIVE
Nitrite: NEGATIVE
Protein, ur: NEGATIVE mg/dL
Urobilinogen, UA: 0.2 mg/dL (ref 0.0–1.0)

## 2012-12-16 MED ORDER — SODIUM CHLORIDE 0.9 % IV BOLUS (SEPSIS)
1000.0000 mL | Freq: Once | INTRAVENOUS | Status: AC
  Administered 2012-12-16: 1000 mL via INTRAVENOUS

## 2012-12-16 MED ORDER — DIPHENHYDRAMINE HCL 50 MG/ML IJ SOLN
25.0000 mg | Freq: Once | INTRAMUSCULAR | Status: AC
  Administered 2012-12-16: 25 mg via INTRAVENOUS
  Filled 2012-12-16: qty 1

## 2012-12-16 MED ORDER — PREDNISONE 20 MG PO TABS: 40.0000 mg | ORAL_TABLET | Freq: Every day | ORAL | Status: DC

## 2012-12-16 NOTE — ED Notes (Signed)
Pt provided hand written list of symptoms: dizzy, nausea, joint pain, neuropathy, head pressure - frontal, temporal, occassional headaches, muscle twitches, drunk feelings, memory issues, SOB, fatigue, weakness, lodged throat, chills, poor apetite, poor fluid intake, increased saliva, metallic taste, scratchy throat, acid reflux.

## 2012-12-16 NOTE — ED Notes (Signed)
Pt states she was treated on the 25th of July for possible rabies exposure on July 12th. Complains of sob, dizzy, multiple other symptoms.

## 2012-12-16 NOTE — ED Provider Notes (Signed)
CSN: [id number]     Arrival date & time 12/16/12  1631 History     First MD Initiated Contact with Patient 12/16/12 1712     Chief Complaint  Patient presents with  . Dizziness  . Shortness of Breath   (Consider location/radiation/quality/duration/timing/severity/associated sxs/prior Treatment) Patient is a 32 y.o. female presenting with shortness of breath. The history is provided by the patient.  Shortness of Breath Severity:  Moderate Onset quality:  Gradual Duration:  5 days Timing:  Intermittent Progression:  Unchanged Chronicity:  New Context comment:  Started after getting rabies immunoglobulin Relieved by:  Nothing Worsened by:  Nothing tried Ineffective treatments:  None tried Associated symptoms: abdominal pain and headaches   Associated symptoms: no chest pain, no cough, no diaphoresis, no fever and no sore throat   Associated symptoms comment:  Also says occasionally she thinks she sees things.  Pt states sx started with abd cramping but now more headache and some tingling in the left leg Risk factors: no recent alcohol use     Past Medical History  Diagnosis Date  . Renal disorder     under active right kidney  . Kidney infection   . Asthma    Past Surgical History  Procedure Laterality Date  . Tubal ligation    . Breast enhancement surgery     No family history on file. History  Substance Use Topics  . Smoking status: Current Every Day Smoker -- 0.50 packs/day    Types: Cigarettes  . Smokeless tobacco: Not on file  . Alcohol Use: Yes     Comment: rarely   OB History   Grav Para Term Preterm Abortions TAB SAB Ect Mult Living   4    1          Review of Systems  Constitutional: Negative for fever and diaphoresis.  HENT: Negative for sore throat.   Respiratory: Positive for shortness of breath. Negative for cough.   Cardiovascular: Negative for chest pain.  Gastrointestinal: Positive for abdominal pain.  Neurological: Positive for dizziness,  numbness and headaches.  All other systems reviewed and are negative.    Allergies  Flagyl  Home Medications   Current Outpatient Rx  Name  Route  Sig  Dispense  Refill  . acetaminophen (TYLENOL) 500 MG tablet   Oral   Take 500 mg by mouth every 6 (six) hours as needed. pain         . albuterol (PROVENTIL HFA;VENTOLIN HFA) 108 (90 BASE) MCG/ACT inhaler   Inhalation   Inhale 2 puffs into the lungs every 6 (six) hours as needed. For emergency asthma symoms         . ciprofloxacin (CIPRO) 500 MG tablet   Oral   Take 1 tablet (500 mg total) by mouth 2 (two) times daily.   10 tablet   0   . Probiotic Product (PRO-BIOTIC BLEND PO)   Oral   Take 1 tablet by mouth daily.          BP 123/89  Pulse 82  Temp(Src) 98.3 F (36.8 C) (Oral)  Resp 16  Ht 5\' 2"  (1.575 m)  Wt 101 lb 9.6 oz (46.085 kg)  BMI 18.58 kg/m2  SpO2 100%  LMP 11/06/2012 Physical Exam  Nursing note and vitals reviewed. Constitutional: She is oriented to person, place, and time. She appears well-developed and well-nourished. No distress.  HENT:  Head: Normocephalic and atraumatic.  Mouth/Throat: Oropharynx is clear and moist.  Eyes: Conjunctivae and EOM are  normal. Pupils are equal, round, and reactive to light.  Neck: Normal range of motion. Neck supple.  Cardiovascular: Normal rate, regular rhythm and intact distal pulses.   No murmur heard. Pulmonary/Chest: Effort normal and breath sounds normal. No respiratory distress. She has no wheezes. She has no rales.  Abdominal: Soft. She exhibits no distension. There is no tenderness. There is no rebound and no guarding.  Musculoskeletal: Normal range of motion. She exhibits no edema and no tenderness.  Neurological: She is alert and oriented to person, place, and time.  Skin: Skin is warm and dry. No rash noted. No erythema.  Psychiatric: She has a normal mood and affect. Her behavior is normal.    ED Course   Procedures (including critical care  time)  Labs Reviewed  CBC WITH DIFFERENTIAL - Abnormal; Notable for the following:    WBC 3.8 (*)    Platelets 134 (*)    Monocytes Relative 14 (*)    All other components within normal limits  COMPREHENSIVE METABOLIC PANEL - Abnormal; Notable for the following:    Potassium 3.4 (*)    Alkaline Phosphatase 32 (*)    All other components within normal limits  URINALYSIS, ROUTINE W REFLEX MICROSCOPIC - Abnormal; Notable for the following:    APPearance CLOUDY (*)    Ketones, ur 15 (*)    All other components within normal limits   No results found. 1. Adverse reaction to rabies vaccine, initial encounter     MDM   Patient presenting with multiple vague symptoms including intermittent shortness of breath, headache, dizziness, feeling a lump in her throat and generally ill feeling that started within 12 hours of getting a rabies immunoglobulin and vaccine. These symptoms started on June 26 and have continued. Patient had her second rabies vaccine on Monday in symptoms persisted but have not been any worse. Based on symptoms story and the cats appearance that bit her she does need rabies as it cannot be confirmed that the cat is actually being observed or has been terminated.  The patient will need the last 2 rabies vaccines. She currently is not having anaphylaxis. Will give Benadryl and also recommended NSAIDs. CBC, CMP and UA pending.  11:59 PM Labs wnl. Some improvement with benadryl.  Given steroids for possible worsening reaction with subsequent shots.  Gwyneth Sprout, MD 12/17/12 0002

## 2012-12-18 ENCOUNTER — Emergency Department (HOSPITAL_COMMUNITY)
Admission: EM | Admit: 2012-12-18 | Discharge: 2012-12-18 | Disposition: A | Discharge status: Home / Self Care | Payer: Self-pay | Attending: Emergency Medicine | Admitting: Emergency Medicine

## 2012-12-18 ENCOUNTER — Encounter (HOSPITAL_COMMUNITY): Payer: Self-pay | Admitting: Emergency Medicine

## 2012-12-18 DIAGNOSIS — F17200 Nicotine dependence, unspecified, uncomplicated: Secondary | ICD-10-CM | POA: Insufficient documentation

## 2012-12-18 DIAGNOSIS — T50B95A Adverse effect of other viral vaccines, initial encounter: Secondary | ICD-10-CM | POA: Insufficient documentation

## 2012-12-18 DIAGNOSIS — IMO0002 Reserved for concepts with insufficient information to code with codable children: Secondary | ICD-10-CM | POA: Insufficient documentation

## 2012-12-18 DIAGNOSIS — R630 Anorexia: Secondary | ICD-10-CM | POA: Insufficient documentation

## 2012-12-18 DIAGNOSIS — T50B95D Adverse effect of other viral vaccines, subsequent encounter: Secondary | ICD-10-CM

## 2012-12-18 DIAGNOSIS — R200 Anesthesia of skin: Secondary | ICD-10-CM

## 2012-12-18 DIAGNOSIS — J45909 Unspecified asthma, uncomplicated: Secondary | ICD-10-CM | POA: Insufficient documentation

## 2012-12-18 DIAGNOSIS — F411 Generalized anxiety disorder: Secondary | ICD-10-CM | POA: Insufficient documentation

## 2012-12-18 DIAGNOSIS — Z79899 Other long term (current) drug therapy: Secondary | ICD-10-CM | POA: Insufficient documentation

## 2012-12-18 DIAGNOSIS — R110 Nausea: Secondary | ICD-10-CM | POA: Insufficient documentation

## 2012-12-18 DIAGNOSIS — Y9289 Other specified places as the place of occurrence of the external cause: Secondary | ICD-10-CM | POA: Insufficient documentation

## 2012-12-18 DIAGNOSIS — IMO0001 Reserved for inherently not codable concepts without codable children: Secondary | ICD-10-CM | POA: Insufficient documentation

## 2012-12-18 DIAGNOSIS — A6920 Lyme disease, unspecified: Secondary | ICD-10-CM | POA: Insufficient documentation

## 2012-12-18 DIAGNOSIS — R5381 Other malaise: Secondary | ICD-10-CM | POA: Insufficient documentation

## 2012-12-18 DIAGNOSIS — R5383 Other fatigue: Secondary | ICD-10-CM | POA: Insufficient documentation

## 2012-12-18 DIAGNOSIS — Z23 Encounter for immunization: Secondary | ICD-10-CM | POA: Insufficient documentation

## 2012-12-18 DIAGNOSIS — R42 Dizziness and giddiness: Secondary | ICD-10-CM | POA: Insufficient documentation

## 2012-12-18 DIAGNOSIS — Y939 Activity, unspecified: Secondary | ICD-10-CM | POA: Insufficient documentation

## 2012-12-18 DIAGNOSIS — F419 Anxiety disorder, unspecified: Secondary | ICD-10-CM

## 2012-12-18 DIAGNOSIS — Z87448 Personal history of other diseases of urinary system: Secondary | ICD-10-CM | POA: Insufficient documentation

## 2012-12-18 MED ORDER — RABIES VACCINE, PCEC IM SUSR
1.0000 mL | Freq: Once | INTRAMUSCULAR | Status: AC
  Administered 2012-12-18: 1 mL via INTRAMUSCULAR
  Filled 2012-12-18: qty 1

## 2012-12-18 NOTE — ED Notes (Signed)
Pt was bitten by a stray cat on 7/25. Per pt, cat is being monitored at the animal shelter for possible rabies. Pt is due for 3rd rabies shot today. Pt denies any fevers, but has had some [place].

## 2012-12-18 NOTE — Progress Notes (Signed)
[facility] did not get to see patient but will be sending her information about the [facility], using the address provided.  °

## 2012-12-18 NOTE — ED Provider Notes (Signed)
CSN: [id number]     Arrival date & time 12/18/12  1423 History     First MD Initiated Contact with Patient 12/18/12 1428     Chief Complaint  Patient presents with  . Rabies Injection   (Consider location/radiation/quality/duration/timing/severity/associated sxs/prior Treatment) HPI Pt is a 32yo female presenting today for 3rd rabies vaccine after initially being bitten by a stray cat she took in on 7/25.  Cat is still being monitored at the animal shelter, pt states it is difficult to get a good story as far as how the cat is doing.  Pt states she was first told the animal was euthanized, then she was told it was being observed so she would prefer to continue the rabies vaccines and complete the series to be safe.  Pt reports having chronic lyme disease but states she has had an allergic reaction since initial immunoglobulin rabies vaccine, including numbness in left side of face, arm, and left leg.  Reports being nauseous but no vomiting, fever, or diarhea.  Reports not having a big appetite for the past few days and has only been eating 1 meal per day.  States she was being tx for UTI last week however has since stopped Cipro in that she was told her urine did not look infected and she was experiencing increased dizziness while taking Cipro.  Denies hx of anxiety or recent drug use.   Past Medical History  Diagnosis Date  . Renal disorder     under active right kidney  . Kidney infection   . Asthma    Past Surgical History  Procedure Laterality Date  . Tubal ligation    . Breast enhancement surgery     History reviewed. No pertinent family history. History  Substance Use Topics  . Smoking status: Current Every Day Smoker -- 0.50 packs/day    Types: Cigarettes  . Smokeless tobacco: Not on file  . Alcohol Use: Yes     Comment: rarely   OB History   Grav Para Term Preterm Abortions TAB SAB Ect Mult Living   4    1          Review of Systems  Constitutional: Positive for  appetite change ( decreased) and fatigue. Negative for fever, chills, diaphoresis and unexpected weight change.  Gastrointestinal: Positive for nausea. Negative for vomiting, abdominal pain and diarrhea.  Musculoskeletal: Positive for myalgias and arthralgias. Negative for back pain, joint swelling and gait problem.  Skin: Negative for rash and wound.  Neurological: Positive for dizziness, weakness and numbness.  All other systems reviewed and are negative.    Allergies  Flagyl  Home Medications   Current Outpatient Rx  Name  Route  Sig  Dispense  Refill  . albuterol (PROVENTIL HFA;VENTOLIN HFA) 108 (90 BASE) MCG/ACT inhaler   Inhalation   Inhale 2 puffs into the lungs every 6 (six) hours as needed. For emergency asthma symoms         . ibuprofen (ADVIL,MOTRIN) 200 MG tablet   Oral   Take 600 mg by mouth every 6 (six) hours as needed for pain.          BP 126/88  Pulse 98  Temp(Src) 98.7 F (37.1 C) (Oral)  Resp 18  SpO2 98%  LMP 11/06/2012 Physical Exam  Nursing note and vitals reviewed. Constitutional: She is oriented to person, place, and time. She appears well-developed and well-nourished. No distress.  Pt appears anxious, sitting on exam bed. NAD.  HENT:  Head: Normocephalic  and atraumatic.  Eyes: Conjunctivae are normal. No scleral icterus.  Neck: Normal range of motion.  Cardiovascular: Normal rate, regular rhythm and normal heart sounds.   Pulmonary/Chest: Effort normal and breath sounds normal. No respiratory distress. She has no wheezes. She has no rales. She exhibits no tenderness.  Abdominal: Soft. Bowel sounds are normal. She exhibits no distension and no mass. There is no tenderness. There is no rebound and no guarding.  Musculoskeletal: Normal range of motion.  Neurological: She is alert and oriented to person, place, and time. She has normal strength. No cranial nerve deficit. She displays a negative Romberg sign. Coordination and gait normal. GCS eye  subscore is 4. GCS verbal subscore is 5. GCS motor subscore is 6.  CN II-XII in tact, no focal deficit, nl finger to nose coordination. Nl sensation, 5/5 strength in all major muscle groups. Neg romberg and nl gait.    Skin: Skin is warm and dry. She is not diaphoretic.  Psychiatric: Her speech is normal. Her mood appears anxious.    ED Course   Procedures (including critical care time)  Labs Reviewed - No data to display No results found. 1. Rabies vaccine adverse reaction, subsequent encounter   2. Rabies, need for prophylactic vaccination against   3. Numbness and tingling of left leg   4. Dizziness   5. Anxiety   6. Lyme disease     MDM  Pt was given choice hold off on next rabies vaccination and f/u with animal control about condition of quarantined cat as it has been over 10 days since initial bite, however pt states she would prefer to complete rabies treatment in fear animal control does will come back to tell pt the cat had rabies.  Pt understands there is not known medications to help with described side effects of vaccine.  3rd vaccine given in ED.  Pt appears well.  Advised to f/u with [facility] info provided, and/or PCP who has seen her in the past for her Chronic Lyme disease as this could be contributing to pt's reported symptoms.  Pt also advised to eat more than 1 meal per day and stay well hydrated to help with dizziness.   Return precautions given. Pt verbalized understanding and agreement with tx plan. Vitals: unremarkable. Discharged in stable condition.    Discussed pt with attending during ED encounter.   Junius Finner, PA-C 12/18/12 1725

## 2012-12-19 NOTE — ED Provider Notes (Signed)
   Medical screening examination/treatment/procedure(s) were performed by non-physician practitioner and as supervising physician I was immediately available for consultation/collaboration.       Gerhard Munch, MD 12/19/12 [phone]

## 2012-12-24 ENCOUNTER — Encounter (HOSPITAL_COMMUNITY): Payer: Self-pay | Admitting: Emergency Medicine

## 2012-12-24 ENCOUNTER — Emergency Department (HOSPITAL_COMMUNITY)
Admission: EM | Admit: 2012-12-24 | Discharge: 2012-12-24 | Disposition: A | Discharge status: Home / Self Care | Payer: Self-pay | Attending: Emergency Medicine | Admitting: Emergency Medicine

## 2012-12-24 ENCOUNTER — Emergency Department (HOSPITAL_COMMUNITY): Payer: Self-pay

## 2012-12-24 DIAGNOSIS — J45909 Unspecified asthma, uncomplicated: Secondary | ICD-10-CM | POA: Insufficient documentation

## 2012-12-24 DIAGNOSIS — F17200 Nicotine dependence, unspecified, uncomplicated: Secondary | ICD-10-CM | POA: Insufficient documentation

## 2012-12-24 DIAGNOSIS — Z87448 Personal history of other diseases of urinary system: Secondary | ICD-10-CM | POA: Insufficient documentation

## 2012-12-24 DIAGNOSIS — R202 Paresthesia of skin: Secondary | ICD-10-CM

## 2012-12-24 DIAGNOSIS — R6883 Chills (without fever): Secondary | ICD-10-CM | POA: Insufficient documentation

## 2012-12-24 DIAGNOSIS — R209 Unspecified disturbances of skin sensation: Secondary | ICD-10-CM | POA: Insufficient documentation

## 2012-12-24 DIAGNOSIS — Z79899 Other long term (current) drug therapy: Secondary | ICD-10-CM | POA: Insufficient documentation

## 2012-12-24 DIAGNOSIS — R42 Dizziness and giddiness: Secondary | ICD-10-CM | POA: Insufficient documentation

## 2012-12-24 DIAGNOSIS — R630 Anorexia: Secondary | ICD-10-CM | POA: Insufficient documentation

## 2012-12-24 DIAGNOSIS — R262 Difficulty in walking, not elsewhere classified: Secondary | ICD-10-CM | POA: Insufficient documentation

## 2012-12-24 LAB — RAPID URINE DRUG SCREEN, HOSP PERFORMED: Opiates: NOT DETECTED

## 2012-12-24 LAB — CBC WITH DIFFERENTIAL/PLATELET
Basophils Absolute: 0 10*3/uL (ref 0.0–0.1)
Basophils Relative: 1 % (ref 0–1)
Eosinophils Relative: 1 % (ref 0–5)
HCT: 41.6 % (ref 36.0–46.0)
MCHC: 34.9 g/dL (ref 30.0–36.0)
Monocytes Absolute: 0.5 10*3/uL (ref 0.1–1.0)
Neutro Abs: 3 10*3/uL (ref 1.7–7.7)
Platelets: 175 10*3/uL (ref 150–400)
RDW: 12.2 % (ref 11.5–15.5)

## 2012-12-24 LAB — BASIC METABOLIC PANEL
Calcium: 9.9 mg/dL (ref 8.4–10.5)
Chloride: 100 mEq/L (ref 96–112)
Creatinine, Ser: 0.8 mg/dL (ref 0.50–1.10)
GFR calc Af Amer: >90 mL/min (ref >90)
Sodium: 135 mEq/L (ref 135–145)

## 2012-12-24 MED ORDER — MECLIZINE HCL 12.5 MG PO TABS: 12.5000 mg | ORAL_TABLET | Freq: Three times a day (TID) | ORAL | Status: DC | PRN

## 2012-12-24 NOTE — ED Provider Notes (Signed)
CSN: [id number]     Arrival date & time 12/24/12  1908 History     First MD Initiated Contact with Patient 12/24/12 2002     Chief Complaint  Patient presents with  . Dizziness  . Numbness   (Consider location/radiation/quality/duration/timing/severity/associated sxs/prior Treatment) HPI  32 year old female who was recently bitten by a stray cat on July 12th and has been receiving rabies vaccination presents complaining of dizziness and numbness.  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  The kitten initially were affectionate but one day it was aggressive and bit her on her hand.  The stray cat also licked her on her mouth on July 12th.  A few days later she felt "not myself".  Report feeling sick.  She f/u at [facility] and received rabies IVIG and rabies vaccine on July 25th.  She has been receiving 3 separates vaccination.  For the past week she has been having tingling and numbness sensation primarily to her left side of body starting at her feet and has now radiates up to her head.  She report having difficulty walking due to numbness sensation, has trouble "knowing when to urinate" because she have loss of sensation.  She also report feeling dizziness with room spinning around for the same duration.  Sxs is intermittent, wax and wane.  She also report tingling sensation around her mouth and having occasional visual hallucination and difficulty thinking.  Reports having chills, decreased in appetite.  The cat was captured at animal control, but unsure rabies status.  Unsure if the cat is being monitored or have been euthanized.      Past Medical History  Diagnosis Date  . Renal disorder     under active right kidney  . Kidney infection   . Asthma    Past Surgical History  Procedure Laterality Date  . Tubal ligation    . Breast enhancement surgery     History reviewed. No pertinent family history. History  Substance Use Topics  . Smoking  status: Current Every Day Smoker -- 0.50 packs/day    Types: Cigarettes  . Smokeless tobacco: Not on file  . Alcohol Use: Yes     Comment: rarely   OB History   Grav Para Term Preterm Abortions TAB SAB Ect Mult Living   4    1          Review of Systems  Constitutional: Negative for fever.  Neurological: Negative for headaches.  All other systems reviewed and are negative.    Allergies  Flagyl  Home Medications   Current Outpatient Rx  Name  Route  Sig  Dispense  Refill  . albuterol (PROVENTIL HFA;VENTOLIN HFA) 108 (90 BASE) MCG/ACT inhaler   Inhalation   Inhale 2 puffs into the lungs every 6 (six) hours as needed. For emergency asthma symoms         . ibuprofen (ADVIL,MOTRIN) 200 MG tablet   Oral   Take 800 mg by mouth every 8 (eight) hours as needed for pain.          . Probiotic Product (PROBIOTIC DAILY PO)   Oral   Take 1 capsule by mouth daily.          BP 117/90  Pulse 74  Temp(Src) 98 F (36.7 C) (Oral)  Resp 18  Ht 5\' 2"  (1.575 m)  Wt 98 lb 4 oz (44.566 kg)  BMI 17.97 kg/m2  SpO2 100%  LMP 11/06/2012 Physical Exam  Nursing note and vitals reviewed. Constitutional: She is oriented to person, place, and time. She appears well-developed and well-nourished. No distress.  Awake, alert, nontoxic appearance  HENT:  Head: Atraumatic.  Right Ear: External ear normal.  Left Ear: External ear normal.  Eyes: Conjunctivae are normal. Right eye exhibits no discharge. Left eye exhibits no discharge.  Neck: Neck supple.  Cardiovascular: Normal rate and regular rhythm.   Pulmonary/Chest: Effort normal. No respiratory distress. She exhibits no tenderness.  Abdominal: Soft. There is no tenderness. There is no rebound.  Genitourinary:  Rectal tone deferred per pt's request  Musculoskeletal: She exhibits no tenderness.  ROM appears intact, no obvious focal weakness  Neurological: She is alert and oriented to person, place, and time. She has normal reflexes.   Mental status and motor strength appears intact  Speech clear, pupils equal round reactive to light, extraocular movements intact   Normal peripheral visual fields Cranial nerves III through XII normal including no facial droop Follows commands, moves all extremities x4, normal strength to bilateral upper and lower extremities at all major muscle groups including grip Sensation decreased to light touch 2 point discrimination Coordination intact, no limb ataxia, finger-nose-finger normal Rapid alternating movements normal No pronator drift Gait normal   Skin: No rash noted.  Psychiatric: She has a normal mood and affect.    ED Course   Procedures (including critical care time)  8:46 PM Pt report having paresthesia for the past 1 week, recall having licked by a stray cat on July 12th and has been having rabies vaccination (3rd series).  No focal neurologic finding on exam, only subjective paresthesia to her lower extremities.  Patella DTR mildly hyperreflexive bilaterally.  No foot drop, normal Babinski. No respiratory distress.  Pt is overly concerned of her sxs.  I have discussed with my attending who recommend consulting neurologist for recommendation.    8:56 PM i have consulted neurologist, Dr. Lu Duffel who felt sxs does not point to any specific etiology.  He recommend UDS and head CT.  Otherwise, no other recommendation.  Per [employer], Rabies vaccine can have dizziness as side effect, and Guillian Barre syndrome as a rare side effect.  I do not suspect Guillian Barre Syndrome.  My attending agrees after evaluate pt.  However, will consult ID for further recommendation.    9:39 PM i have also consulted ID, Dr. Daiva Eves who does not think pt is having side effect or Guillian Barre Syndrome.  He recommend discuss risk/benefit of rabies shot and sts it would be OK for pt to not take the next dosage.  Pt otherwise afebrile, VSS.    11:25 PM Head CT is unremarkable. Her labs are  reassuring. Urine drug screen is unremarkable. Patient reassured. Pt stable for discharge.  Return precaution given.  My attending is aware.   Labs Reviewed  CBC WITH DIFFERENTIAL  BASIC METABOLIC PANEL  URINE RAPID DRUG SCREEN (HOSP PERFORMED)   Ct Head Wo Contrast  12/24/2012   *RADIOLOGY REPORT*  Clinical Data:  The patient states that she has been getting Rabies shots since July 25 and has been experiencing dizziness and numbness in the lower extremities, which has been worsening.  CT HEAD WITHOUT CONTRAST  Technique:  Contiguous axial images were obtained from the base of the skull through the vertex without contrast  Comparison:  None.  Findings:  The brain has a normal appearance without evidence for hemorrhage, acute infarction, hydrocephalus, or mass lesion.  There is no extra axial fluid  collection.  The skull and paranasal sinuses are normal.  IMPRESSION: Normal CT of the head without contrast.   Original Report Authenticated By: Amie Portland, M.D.   1. Dizziness   2. Paresthesia     MDM  BP 117/90  Pulse 74  Temp(Src) 98 F (36.7 C) (Oral)  Resp 18  Ht 5\' 2"  (1.575 m)  Wt 98 lb 4 oz (44.566 kg)  BMI 17.97 kg/m2  SpO2 100%  LMP 12/17/2012  I have reviewed nursing notes and vital signs. I personally reviewed the imaging tests through PACS system  I reviewed available ER/hospitalization records thought the EMR   Fayrene Helper, [place] 12/24/12 2328

## 2012-12-24 NOTE — ED Provider Notes (Signed)
See prior note   Ward Givens, MD 12/24/12 2328

## 2012-12-24 NOTE — ED Provider Notes (Signed)
Patient reports they adopted a Engineer, structural. It bit her fianc when he first got it by reaching under her vehicle and grabbing it from behind. She reports it was initially playful however it did bite her and at one point licked her on the lip. They reported started having some unusual behaviors such as hiding and dark corners and staring at them. She had the animal control come take the cat. She states she was initially told the cat was euthanized and sent to the state for analysis however she was then told they are were watching it for signs of illness. Animal control was involved with animal since July 12. She reports her most severe incident was on July 12. Eleven days later on July 23 she started having numbness in her left leg that has since spread up to her left scalp and now involves her whole body from the tip of her toes to the top of her head. She states 2 weeks ago she started having loss of feeling that she needed to urinate. She states she felt some pressure and when she would go to the bathroom she would urinate. She has not been incontinent. She also states she doesn't have much feeling that she needs to have a bowel movement but she is able to have bowel movements. She is not having rectal incontinence. Patient is concerned because her last rabies vaccine is tomorrow. She states she may be having a reaction to the vaccine.  Patient seems very excitable and anxious. She is moving all of her extremities well, there is no facial asymmetry seen.  Medical screening examination/treatment/procedure(s) were conducted as a shared visit with non-physician practitioner(s) and myself.  I personally evaluated the patient during the encounter  Devoria Albe, MD, Franz Dell, MD 12/24/12 [id number]

## 2012-12-24 NOTE — ED Notes (Signed)
Pt states that she has been getting rabies vaccinations since the 25th of July and has been c/o dizziness and numbness in lower extremities since then and it has been getting worse.

## 2012-12-25 ENCOUNTER — Emergency Department (HOSPITAL_COMMUNITY)
Admission: EM | Admit: 2012-12-25 | Discharge: 2012-12-25 | Disposition: A | Discharge status: Home / Self Care | Payer: Self-pay | Source: Home / Self Care | Attending: Family Medicine | Admitting: Family Medicine

## 2012-12-25 ENCOUNTER — Encounter (HOSPITAL_COMMUNITY): Payer: Self-pay | Admitting: *Deleted

## 2012-12-25 MED ORDER — RABIES VACCINE, PCEC IM SUSR
INTRAMUSCULAR | Status: AC
  Filled 2012-12-25: qty 1

## 2012-12-25 MED ORDER — RABIES VACCINE, PCEC IM SUSR
1.0000 mL | Freq: Once | INTRAMUSCULAR | Status: AC
  Administered 2012-12-25: 1 mL via INTRAMUSCULAR

## 2012-12-25 NOTE — ED Notes (Signed)
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  Discussed with Langston Masker, PA; explained to pt that we typically only do 5th dose for pt with documented low cell counts (i.e. HIV, cancer, etc) or for patients on immunosuppressant therapy.  Instructed pt to f/u with her PCP on Monday to determine whether or not they would prefer she have the 5th dose.  Pt verbalized understanding.

## 2012-12-25 NOTE — ED Notes (Signed)
Reports being at hospital yesterday for BLE parasthesias.  Was told to complete rabies course due to risk of exposure.

## 2013-09-01 ENCOUNTER — Ambulatory Visit: Payer: Self-pay | Admitting: Women's Health

## 2013-09-08 ENCOUNTER — Ambulatory Visit: Payer: Self-pay | Admitting: Women's Health

## 2013-09-09 ENCOUNTER — Encounter: Payer: Self-pay | Admitting: Women's Health

## 2013-09-09 ENCOUNTER — Ambulatory Visit (INDEPENDENT_AMBULATORY_CARE_PROVIDER_SITE_OTHER): Payer: 59 | Admitting: Women's Health

## 2013-09-09 ENCOUNTER — Other Ambulatory Visit (HOSPITAL_COMMUNITY)
Admission: RE | Admit: 2013-09-09 | Discharge: 2013-09-09 | Disposition: A | Discharge status: Home / Self Care | Payer: 59 | Source: Ambulatory Visit | Attending: Women's Health | Admitting: Women's Health

## 2013-09-09 VITALS — BP 120/76 | Ht 63.0 in | Wt 112.0 lb

## 2013-09-09 DIAGNOSIS — Z01419 Encounter for gynecological examination (general) (routine) without abnormal findings: Secondary | ICD-10-CM | POA: Insufficient documentation

## 2013-09-09 DIAGNOSIS — A6920 Lyme disease, unspecified: Secondary | ICD-10-CM | POA: Insufficient documentation

## 2013-09-09 DIAGNOSIS — N390 Urinary tract infection, site not specified: Secondary | ICD-10-CM

## 2013-09-09 DIAGNOSIS — B3731 Acute candidiasis of vulva and vagina: Secondary | ICD-10-CM

## 2013-09-09 DIAGNOSIS — Z13220 Encounter for screening for lipoid disorders: Secondary | ICD-10-CM

## 2013-09-09 DIAGNOSIS — B373 Candidiasis of vulva and vagina: Secondary | ICD-10-CM

## 2013-09-09 DIAGNOSIS — Z1151 Encounter for screening for human papillomavirus (HPV): Secondary | ICD-10-CM | POA: Insufficient documentation

## 2013-09-09 DIAGNOSIS — E039 Hypothyroidism, unspecified: Secondary | ICD-10-CM

## 2013-09-09 LAB — CBC WITH DIFFERENTIAL/PLATELET
BASOS ABS: 0 10*3/uL (ref 0.0–0.1)
BASOS PCT: 0 % (ref 0–1)
EOS ABS: 0 10*3/uL (ref 0.0–0.7)
EOS PCT: 1 % (ref 0–5)
HEMATOCRIT: 41.4 % (ref 36.0–46.0)
HEMOGLOBIN: 14.1 g/dL (ref 12.0–15.0)
Lymphocytes Relative: 26 % (ref 12–46)
Lymphs Abs: 1.2 10*3/uL (ref 0.7–4.0)
MCH: 30.1 pg (ref 26.0–34.0)
MCHC: 34.1 g/dL (ref 30.0–36.0)
MCV: 88.5 fL (ref 78.0–100.0)
MONO ABS: 0.4 10*3/uL (ref 0.1–1.0)
MONOS PCT: 9 % (ref 3–12)
Neutro Abs: 2.9 10*3/uL (ref 1.7–7.7)
Neutrophils Relative %: 64 % (ref 43–77)
Platelets: 179 10*3/uL (ref 150–400)
RBC: 4.68 MIL/uL (ref 3.87–5.11)
RDW: 13.3 % (ref 11.5–15.5)
WBC: 4.6 10*3/uL (ref 4.0–10.5)

## 2013-09-09 LAB — LIPID PANEL
CHOL/HDL RATIO: 2.8 ratio
Cholesterol: 158 mg/dL (ref 0–200)
HDL: 57 mg/dL (ref >39)
LDL CALC: 93 mg/dL (ref 0–99)
TRIGLYCERIDES: 38 mg/dL (ref <150)
VLDL: 8 mg/dL (ref 0–40)

## 2013-09-09 LAB — URINALYSIS W MICROSCOPIC + REFLEX CULTURE
Bilirubin Urine: NEGATIVE
CASTS: NONE SEEN
CRYSTALS: NONE SEEN
Glucose, UA: NEGATIVE mg/dL
KETONES UR: NEGATIVE mg/dL
NITRITE: NEGATIVE
PH: 5.5 (ref 5.0–8.0)
Protein, ur: NEGATIVE mg/dL
SPECIFIC GRAVITY, URINE: 1.02 (ref 1.005–1.030)
UROBILINOGEN UA: 0.2 mg/dL (ref 0.0–1.0)

## 2013-09-09 LAB — COMPREHENSIVE METABOLIC PANEL
ALBUMIN: 4.5 g/dL (ref 3.5–5.2)
ALK PHOS: 34 U/L — AB (ref 39–117)
ALT: 10 U/L (ref 0–35)
AST: 13 U/L (ref 0–37)
BUN: 11 mg/dL (ref 6–23)
CO2: 27 mEq/L (ref 19–32)
CREATININE: 0.7 mg/dL (ref 0.50–1.10)
Calcium: 9.4 mg/dL (ref 8.4–10.5)
Chloride: 101 mEq/L (ref 96–112)
GLUCOSE: 75 mg/dL (ref 70–99)
POTASSIUM: 3.6 meq/L (ref 3.5–5.3)
Sodium: 136 mEq/L (ref 135–145)
Total Bilirubin: 1.4 mg/dL — ABNORMAL HIGH (ref 0.2–1.2)
Total Protein: 6.8 g/dL (ref 6.0–8.3)

## 2013-09-09 LAB — WET PREP FOR TRICH, YEAST, CLUE
Clue Cells Wet Prep HPF POC: NONE SEEN
TRICH WET PREP: NONE SEEN

## 2013-09-09 MED ORDER — NITROFURANTOIN MACROCRYSTAL 50 MG PO CAPS: ORAL_CAPSULE | ORAL | Status: DC

## 2013-09-09 MED ORDER — FLUCONAZOLE 150 MG PO TABS: 150.0000 mg | ORAL_TABLET | Freq: Once | ORAL | Status: DC

## 2013-09-09 MED ORDER — CIPROFLOXACIN HCL 250 MG PO TABS: 250.0000 mg | ORAL_TABLET | Freq: Two times a day (BID) | ORAL | Status: DC

## 2013-09-09 NOTE — Patient Instructions (Signed)

## 2013-09-09 NOTE — Addendum Note (Signed)
Addended by: Dayna BarkerGARDNER, Savon Cobbs K on: 09/09/2013 11:50 AM   Modules accepted: Orders

## 2013-09-09 NOTE — Progress Notes (Signed)
Cynthia Coleman 09/07/1980 [id number]    History:    Presents for annual exam with complaints of vaginal discharge and burning with urination. History of recurrent UTIs, reports 10/year associated with intercourse. Monthly 28 day cycle/BTL/same partner for 4 years, engaged. Reports normal pap history. Breast implants 2010. Cervical laceration with repair after delivery in 2004. 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 Hypothyroid on Synthroid.  Past medical history, past surgical history, family history and social history were all reviewed and documented in the EPIC chart. CMA at [employer]. Previous smoker, quit in 2014. 3 children- 2 girls (11 and 1513), 1 boy 15 autistic. Mother early onset menopause at 4442.  ROS:  A  ROS was performed and pertinent positives and negatives are included.  Exam:  Filed Vitals:   09/09/13 1013  BP: 120/76    General appearance:  Normal Thyroid:  Symmetrical, normal in size, without palpable masses or nodularity. Respiratory  Auscultation:  Clear without wheezing or rhonchi Cardiovascular  Auscultation:  Regular rate, without rubs, murmurs or gallops  Edema/varicosities:  Not grossly evident Abdominal  Soft,nontender, without masses, guarding or rebound.  Liver/spleen:  No organomegaly noted  Hernia:  None appreciated  Skin  Inspection:  Grossly normal   Breasts: Examined lying and sitting. Bilateral saline implants.    Right: Without masses, retractions, discharge or axillary adenopathy.     Left: Without masses, retractions, discharge or axillary adenopathy. Gentitourinary   Inguinal/mons:  Normal without inguinal adenopathy  External genitalia:  Normal  BUS/Urethra/Skene's glands:  Normal  Vagina:  Normal. Wet prep few yeast, moderate bacteria, rare WBC  Cervix:  Normal  Uterus:  normal in size, shape and contour.  Midline and mobile  Adnexa/parametria:     Rt: Without  masses or tenderness.   Lt: Without masses or tenderness.  Anus and perineum: Normal  Digital rectal exam: Normal sphincter tone without palpated masses or tenderness UA: 21-50 WBC, 7-10 RBC, few bacteria  Assessment/Plan:  33 y.o.  SWF G4P3 for annual exam.   Recurrent UTI Yeast infection Chronic Lyme Disease Hypothyroid BTL/montly cycle  Plan: Cipro 250 mg BID x 3 days, Macrodantin 50 mg with intercourse, Diflucan 150 mg once, proper use and side effects discussed. SBE's, continue annual mammogram, calcium rich diet, vitamin D 1000 daily, regular exercise. Follow up with infectious disease doctor for treatment of Lyme disease. UTI and yeast prevention discussed. Review thyroid panel for Synthroid Rx. CBC, CMP, Pap with HR HPV, lipid, thyroid panel, urine culture.   Harrington Challengerancy J Young [facility] ServicesWHNP, 10:56 AM 09/09/2013

## 2013-09-10 ENCOUNTER — Encounter: Payer: Self-pay | Admitting: Women's Health

## 2013-09-10 LAB — T3 UPTAKE: T3 UPTAKE: 33.3 % (ref 22.5–37.0)

## 2013-09-10 LAB — THYROID ANTIBODIES: Thyroperoxidase Ab SerPl-aCnc: <10 IU/mL (ref <35.0)

## 2013-09-10 LAB — T4: T4 TOTAL: 8.8 ug/dL (ref 5.0–12.5)

## 2013-09-10 LAB — TSH: TSH: 2.137 u[IU]/mL (ref 0.350–4.500)

## 2013-09-12 ENCOUNTER — Encounter: Payer: Self-pay | Admitting: Women's Health

## 2013-09-12 LAB — URINE CULTURE: Colony Count: 50000

## 2013-09-21 ENCOUNTER — Encounter: Payer: Self-pay | Admitting: Women's Health

## 2013-09-21 ENCOUNTER — Other Ambulatory Visit: Payer: Self-pay | Admitting: Women's Health

## 2013-09-21 MED ORDER — THYROID 30 MG PO TABS: 30.0000 mg | ORAL_TABLET | Freq: Every day | ORAL | Status: DC

## 2013-11-29 ENCOUNTER — Emergency Department (HOSPITAL_COMMUNITY)
Admission: EM | Admit: 2013-11-29 | Discharge: 2013-11-29 | Disposition: A | Discharge status: Home / Self Care | Payer: 59 | Source: Home / Self Care | Attending: Emergency Medicine | Admitting: Emergency Medicine

## 2013-11-29 ENCOUNTER — Encounter (HOSPITAL_COMMUNITY): Payer: Self-pay | Admitting: Emergency Medicine

## 2013-11-29 DIAGNOSIS — IMO0002 Reserved for concepts with insufficient information to code with codable children: Secondary | ICD-10-CM

## 2013-11-29 DIAGNOSIS — L03011 Cellulitis of right finger: Secondary | ICD-10-CM

## 2013-11-29 MED ORDER — CLINDAMYCIN HCL 300 MG PO CAPS: 300.0000 mg | ORAL_CAPSULE | Freq: Four times a day (QID) | ORAL | Status: DC

## 2013-11-29 NOTE — ED Notes (Signed)
Had swollen area on finger x 2 days and tried to pop it to relieve pressure , now finger is worse

## 2013-11-29 NOTE — ED Provider Notes (Signed)
  Chief Complaint   Chief Complaint  Patient presents with  . Hand Pain    History of Present Illness   Cynthia Coleman is a 33 year old female who has had a two-day history of pain, swelling, and redness of the ulnar nail fold of the right middle finger. She denies any injury to this finger, although she thinks she may have bitten hang nail. She tried to drain it with a pin. There's been some clear drainage. She has pain that extends up the arm and the arm pit but no red streak. She denies any fever or chills. She is able to flex and extend the finger.  Review of Systems   Other than as noted above, the patient denies any of the following symptoms: Systemic:  No fevers or chills. Musculoskeletal:  No joint pain or arthritis.  Neurological:  No muscular weakness or paresthesias.  PMFSH   Past medical history, family history, social history, meds, and allergies were reviewed.   She is allergic to Flagyl.  Physical Examination   Vital signs:  BP 135/99  Pulse 79  Temp(Src) 98.4 F (36.9 C)  Resp 16  SpO2 100% Gen:  Alert and oriented times 3.  In no distress. Musculoskeletal:  Exam of the hand reveals the ulnar nail fold of the right middle finger was swollen and red. There is a small scab overlying it. There is no fluctuance or visible collection of pus. She is able to fully extend the finger, and flex at about half normal but with pain.  Otherwise, all joints had a full a ROM with no swelling, bruising or deformity.  No edema, pulses full. Extremities were warm and pink.  Capillary refill was brisk.  Skin:  Clear, warm and dry.  No rash. Neuro:  Alert and oriented times 3.  Muscle strength was normal.  Sensation was intact to light touch.   Assessment   The encounter diagnosis was Paronychia, right.  There is nothing to culture or to I&D today. She was encouraged to return if he should become worse in any way especially with progression of the erythema or increasing pain.  Plan   1.  Meds:  The following meds were prescribed:   Discharge Medication List as of 11/29/2013 11:39 AM    START taking these medications   Details  clindamycin (CLEOCIN) 300 MG capsule Take 1 capsule (300 mg total) by mouth 4 (four) times daily., Starting 11/29/2013, Until Discontinued, Normal        2.  Patient Education/Counseling:  The patient was given appropriate handouts, self care instructions, and instructed in symptomatic relief, including rest and activity, and elevation.   3.  Follow up:  The patient was told to follow up here if no better in 3 to 4 days, or sooner if becoming worse in any way, and given some red flag symptoms such as worsening pain, fever, swelling, or neurological symptoms which would prompt immediate return.        Reuben Likesavid C Moxie Kalil, MD 11/29/13 [phone]

## 2013-11-29 NOTE — Discharge Instructions (Signed)

## 2013-12-29 ENCOUNTER — Encounter: Payer: Self-pay | Admitting: Internal Medicine

## 2013-12-29 ENCOUNTER — Ambulatory Visit (INDEPENDENT_AMBULATORY_CARE_PROVIDER_SITE_OTHER): Payer: 59 | Admitting: Internal Medicine

## 2013-12-29 ENCOUNTER — Other Ambulatory Visit (INDEPENDENT_AMBULATORY_CARE_PROVIDER_SITE_OTHER): Payer: 59

## 2013-12-29 VITALS — BP 100/80 | HR 78 | Temp 98.6°F | Resp 16 | Ht 62.0 in | Wt 113.0 lb

## 2013-12-29 DIAGNOSIS — R142 Eructation: Secondary | ICD-10-CM

## 2013-12-29 DIAGNOSIS — G8929 Other chronic pain: Secondary | ICD-10-CM

## 2013-12-29 DIAGNOSIS — R140 Abdominal distension (gaseous): Secondary | ICD-10-CM

## 2013-12-29 DIAGNOSIS — E039 Hypothyroidism, unspecified: Secondary | ICD-10-CM

## 2013-12-29 DIAGNOSIS — R1011 Right upper quadrant pain: Secondary | ICD-10-CM

## 2013-12-29 DIAGNOSIS — R141 Gas pain: Secondary | ICD-10-CM

## 2013-12-29 DIAGNOSIS — K589 Irritable bowel syndrome without diarrhea: Secondary | ICD-10-CM | POA: Insufficient documentation

## 2013-12-29 DIAGNOSIS — R143 Flatulence: Secondary | ICD-10-CM

## 2013-12-29 DIAGNOSIS — K2100 Gastro-esophageal reflux disease with esophagitis, without bleeding: Secondary | ICD-10-CM

## 2013-12-29 DIAGNOSIS — F411 Generalized anxiety disorder: Secondary | ICD-10-CM | POA: Insufficient documentation

## 2013-12-29 LAB — CBC WITH DIFFERENTIAL/PLATELET
BASOS ABS: 0 10*3/uL (ref 0.0–0.1)
Basophils Relative: 0.8 % (ref 0.0–3.0)
EOS ABS: 0.1 10*3/uL (ref 0.0–0.7)
Eosinophils Relative: 2.5 % (ref 0.0–5.0)
HEMATOCRIT: 40.8 % (ref 36.0–46.0)
Hemoglobin: 13.9 g/dL (ref 12.0–15.0)
LYMPHS ABS: 1.3 10*3/uL (ref 0.7–4.0)
Lymphocytes Relative: 25.2 % (ref 12.0–46.0)
MCHC: 34.1 g/dL (ref 30.0–36.0)
MCV: 90.3 fl (ref 78.0–100.0)
MONO ABS: 0.4 10*3/uL (ref 0.1–1.0)
Monocytes Relative: 8.5 % (ref 3.0–12.0)
NEUTROS PCT: 63 % (ref 43.0–77.0)
Neutro Abs: 3.2 10*3/uL (ref 1.4–7.7)
PLATELETS: 164 10*3/uL (ref 150.0–400.0)
RBC: 4.51 Mil/uL (ref 3.87–5.11)
RDW: 12.5 % (ref 11.5–15.5)
WBC: 5.1 10*3/uL (ref 4.0–10.5)

## 2013-12-29 LAB — COMPREHENSIVE METABOLIC PANEL
ALBUMIN: 4.5 g/dL (ref 3.5–5.2)
ALT: 12 U/L (ref 0–35)
AST: 16 U/L (ref 0–37)
Alkaline Phosphatase: 36 U/L — ABNORMAL LOW (ref 39–117)
BUN: 13 mg/dL (ref 6–23)
CALCIUM: 9.7 mg/dL (ref 8.4–10.5)
CHLORIDE: 103 meq/L (ref 96–112)
CO2: 28 meq/L (ref 19–32)
CREATININE: 0.7 mg/dL (ref 0.4–1.2)
GFR: 98.59 mL/min (ref >60.00)
GLUCOSE: 86 mg/dL (ref 70–99)
POTASSIUM: 4.2 meq/L (ref 3.5–5.1)
Sodium: 138 mEq/L (ref 135–145)
Total Bilirubin: 1.5 mg/dL — ABNORMAL HIGH (ref 0.2–1.2)
Total Protein: 7 g/dL (ref 6.0–8.3)

## 2013-12-29 LAB — TSH: TSH: 0.91 u[IU]/mL (ref 0.35–4.50)

## 2013-12-29 MED ORDER — SERTRALINE HCL 50 MG PO TABS: 50.0000 mg | ORAL_TABLET | Freq: Every day | ORAL | Status: DC

## 2013-12-29 NOTE — Progress Notes (Signed)
Pre visit review using our clinic review tool, if applicable. No additional management support is needed unless otherwise documented below in the visit note. 

## 2013-12-29 NOTE — Patient Instructions (Signed)

## 2013-12-29 NOTE — Progress Notes (Signed)
Subjective:    Patient ID: Cynthia Coleman Person, female    DOB: 10/20/1980, 33 y.o.   MRN: [id number]  Abdominal Pain This is a chronic problem. The current episode started more than 1 year ago. The onset quality is undetermined. The problem occurs intermittently. The problem has been unchanged. The pain is located in the RUQ. The pain is at a severity of 2/10. The pain is mild. The quality of the pain is colicky. Associated symptoms include belching. Pertinent negatives include no anorexia, arthralgias, constipation, diarrhea, dysuria, fever, flatus, frequency, headaches, hematochezia, hematuria, melena, myalgias, nausea, vomiting or weight loss. The pain is aggravated by eating (fatty foods). The pain is relieved by nothing. The treatment provided no relief.      Review of Systems  Constitutional: Positive for unexpected weight change. Negative for fever, chills, weight loss, diaphoresis, activity change, appetite change and fatigue.  HENT: Negative.  Negative for trouble swallowing and voice change.   Eyes: Negative.  Photophobia: wt gain.  Respiratory: Negative.  Negative for apnea, cough, choking, chest tightness, shortness of breath, wheezing and stridor.   Cardiovascular: Negative.  Negative for chest pain, palpitations and leg swelling.  Gastrointestinal: Positive for abdominal pain. Negative for nausea, vomiting, diarrhea, constipation, blood in stool, melena, hematochezia, abdominal distention, anal bleeding, rectal pain, anorexia and flatus.  Endocrine: Negative.   Genitourinary: Negative.  Negative for dysuria, frequency and hematuria.  Musculoskeletal: Negative.  Negative for arthralgias and myalgias.  Skin: Negative.  Negative for rash.  Allergic/Immunologic: Negative.   Neurological: Positive for dizziness. Negative for tremors, seizures, syncope, facial asymmetry, speech difficulty, weakness, light-headedness, numbness and headaches.  Hematological: Negative.  Negative for adenopathy.  Does not bruise/bleed easily.  Psychiatric/Behavioral: Positive for sleep disturbance and dysphoric mood. Negative for suicidal ideas, hallucinations, behavioral problems, confusion, self-injury, decreased concentration and agitation. The patient is nervous/anxious ("panic"). The patient is not hyperactive.        Objective:   Physical Exam  Vitals reviewed. Constitutional: She is oriented to person, place, and time. She appears well-developed and well-nourished. No distress.  HENT:  Head: Normocephalic and atraumatic.  Mouth/Throat: Oropharynx is clear and moist. No oropharyngeal exudate.  Eyes: Conjunctivae are normal. Right eye exhibits no discharge. Left eye exhibits no discharge. No scleral icterus.  Neck: Normal range of motion. Neck supple. No JVD present. No tracheal deviation present. No thyromegaly present.  Cardiovascular: Normal rate, regular rhythm, normal heart sounds and intact distal pulses.  Exam reveals no gallop and no friction rub.   No murmur heard. Pulmonary/Chest: Effort normal and breath sounds normal. No stridor. No respiratory distress. She has no wheezes. She has no rales. She exhibits no tenderness.  Abdominal: Soft. Bowel sounds are normal. She exhibits no distension and no mass. There is no tenderness. There is no rebound and no guarding.  Musculoskeletal: Normal range of motion. She exhibits no edema and no tenderness.  Lymphadenopathy:    She has no cervical adenopathy.  Neurological: She is oriented to person, place, and time.  Skin: Skin is warm and dry. No rash noted. She is not diaphoretic. No erythema. No pallor.  Psychiatric: She has a normal mood and affect. Her behavior is normal. Judgment and thought content normal.     Lab Results  Component Value Date   WBC 4.6 09/09/2013   HGB 14.1 09/09/2013   HCT 41.4 09/09/2013   PLT 179 09/09/2013   GLUCOSE 75 09/09/2013   CHOL 158 09/09/2013   TRIG 38 09/09/2013   HDL  57 09/09/2013   LDLCALC 93 09/09/2013     ALT 10 09/09/2013   AST 13 09/09/2013   NA 136 09/09/2013   K 3.6 09/09/2013   CL 101 09/09/2013   CREATININE 0.70 09/09/2013   BUN 11 09/09/2013   CO2 27 09/09/2013   TSH 2.137 09/09/2013       Assessment & Plan:

## 2013-12-30 LAB — GLIADIN ANTIBODIES, SERUM
GLIADIN IGG: 2.2 U/mL (ref <20)
Gliadin IgA: 8.9 U/mL (ref <20)

## 2013-12-30 LAB — TISSUE TRANSGLUTAMINASE, IGA: Tissue Transglutaminase Ab, IgA: 6.6 U/mL (ref <20)

## 2013-12-31 ENCOUNTER — Encounter: Payer: Self-pay | Admitting: Internal Medicine

## 2013-12-31 LAB — RETICULIN ANTIBODIES, IGA W TITER: Reticulin Ab, IgA: NEGATIVE

## 2013-12-31 NOTE — Assessment & Plan Note (Signed)
She wants to be tested for celiac disease Will check an u/s to look for stones She wants to see GI as well

## 2013-12-31 NOTE — Assessment & Plan Note (Signed)
She wants to see GI about this - GI referral

## 2013-12-31 NOTE — Assessment & Plan Note (Signed)
Her TSH is in the normal range She will stay on the current dose 

## 2013-12-31 NOTE — Assessment & Plan Note (Signed)
I have ordered an U/S to screen for gallstones Will check her labs to look for organic disease

## 2013-12-31 NOTE — Assessment & Plan Note (Signed)
Will start sertraline for this

## 2014-01-04 ENCOUNTER — Encounter: Payer: Self-pay | Admitting: Internal Medicine

## 2014-01-04 ENCOUNTER — Other Ambulatory Visit: Payer: Self-pay | Admitting: *Deleted

## 2014-01-04 MED ORDER — ALBUTEROL SULFATE HFA 108 (90 BASE) MCG/ACT IN AERS: 2.0000 | INHALATION_SPRAY | Freq: Four times a day (QID) | RESPIRATORY_TRACT | Status: DC | PRN

## 2014-01-04 NOTE — Telephone Encounter (Signed)
Sent email requesting refill on her albuterol inhaler...Raechel Chute/lmb

## 2014-01-05 ENCOUNTER — Encounter: Payer: Self-pay | Admitting: Internal Medicine

## 2014-01-11 ENCOUNTER — Encounter: Payer: Self-pay | Admitting: Internal Medicine

## 2014-01-13 ENCOUNTER — Encounter: Payer: Self-pay | Admitting: Internal Medicine

## 2014-01-18 ENCOUNTER — Other Ambulatory Visit: Payer: Self-pay | Admitting: Internal Medicine

## 2014-01-18 ENCOUNTER — Encounter: Payer: Self-pay | Admitting: Internal Medicine

## 2014-01-18 DIAGNOSIS — F411 Generalized anxiety disorder: Secondary | ICD-10-CM

## 2014-01-18 MED ORDER — DULOXETINE HCL 30 MG PO CPEP: 30.0000 mg | ORAL_CAPSULE | Freq: Every day | ORAL | Status: DC

## 2014-01-20 ENCOUNTER — Ambulatory Visit
Admission: RE | Admit: 2014-01-20 | Discharge: 2014-01-20 | Disposition: A | Discharge status: Home / Self Care | Payer: 59 | Source: Ambulatory Visit | Attending: Internal Medicine | Admitting: Internal Medicine

## 2014-01-20 ENCOUNTER — Encounter: Payer: Self-pay | Admitting: Internal Medicine

## 2014-01-20 DIAGNOSIS — G8929 Other chronic pain: Secondary | ICD-10-CM

## 2014-01-20 DIAGNOSIS — R1011 Right upper quadrant pain: Principal | ICD-10-CM

## 2014-01-21 ENCOUNTER — Other Ambulatory Visit: Payer: Self-pay | Admitting: Women's Health

## 2014-01-21 ENCOUNTER — Encounter: Payer: Self-pay | Admitting: Women's Health

## 2014-01-21 MED ORDER — THYROID 30 MG PO TABS: 30.0000 mg | ORAL_TABLET | Freq: Every day | ORAL | Status: DC

## 2014-02-16 ENCOUNTER — Encounter: Payer: Self-pay | Admitting: Internal Medicine

## 2014-02-25 ENCOUNTER — Encounter: Payer: Self-pay | Admitting: Internal Medicine

## 2014-02-25 NOTE — Telephone Encounter (Signed)
   She should be seen in the emergency room if she is having high fevers or severe weakness in the lower extremities  If symptoms are minor and she simply wants reassurance; the Saturday clinic will be from 9-1 PM tomorrow.

## 2014-03-03 ENCOUNTER — Ambulatory Visit (INDEPENDENT_AMBULATORY_CARE_PROVIDER_SITE_OTHER): Payer: 59 | Admitting: Internal Medicine

## 2014-03-03 ENCOUNTER — Encounter: Payer: Self-pay | Admitting: Internal Medicine

## 2014-03-03 ENCOUNTER — Ambulatory Visit (INDEPENDENT_AMBULATORY_CARE_PROVIDER_SITE_OTHER)
Admission: RE | Admit: 2014-03-03 | Discharge: 2014-03-03 | Disposition: A | Discharge status: Home / Self Care | Payer: 59 | Source: Ambulatory Visit | Attending: Internal Medicine | Admitting: Internal Medicine

## 2014-03-03 VITALS — BP 118/72 | HR 82 | Temp 98.2°F | Resp 16 | Ht 62.0 in | Wt 111.5 lb

## 2014-03-03 DIAGNOSIS — K5900 Constipation, unspecified: Secondary | ICD-10-CM

## 2014-03-03 DIAGNOSIS — R1011 Right upper quadrant pain: Secondary | ICD-10-CM

## 2014-03-03 DIAGNOSIS — K589 Irritable bowel syndrome without diarrhea: Secondary | ICD-10-CM

## 2014-03-03 DIAGNOSIS — R1010 Upper abdominal pain, unspecified: Secondary | ICD-10-CM

## 2014-03-03 DIAGNOSIS — G8929 Other chronic pain: Secondary | ICD-10-CM

## 2014-03-03 MED ORDER — LINACLOTIDE 290 MCG PO CAPS: 290.0000 ug | ORAL_CAPSULE | Freq: Every day | ORAL | Status: DC

## 2014-03-03 NOTE — Assessment & Plan Note (Signed)
I think Linzess will help with this

## 2014-03-03 NOTE — Assessment & Plan Note (Signed)
I have asked her to start Linzess for this

## 2014-03-03 NOTE — Progress Notes (Signed)
Pre visit review using our clinic review tool, if applicable. No additional management support is needed unless otherwise documented below in the visit note. 

## 2014-03-03 NOTE — Progress Notes (Signed)
Subjective:    Patient ID: Cynthia Coleman, female    DOB: 01/22/1981, 33 y.o.   MRN: [id number]  Abdominal Pain This is a recurrent problem. The current episode started more than 1 month ago. The onset quality is gradual. The problem occurs intermittently. The problem has been unchanged. The pain is located in the generalized abdominal region. The pain is at a severity of 1/10. The pain is mild. The quality of the pain is sharp (she feels bloated). The abdominal pain does not radiate. Associated symptoms include anorexia, belching, constipation, flatus and nausea. Pertinent negatives include no arthralgias, diarrhea, dysuria, fever, frequency, headaches, hematochezia, hematuria, melena, myalgias, vomiting or weight loss. Nothing aggravates the pain. The pain is relieved by bowel movements. She has tried nothing for the symptoms. Her past medical history is significant for GERD and irritable bowel syndrome. There is no history of abdominal surgery, colon cancer, Crohn's disease, gallstones, pancreatitis, PUD or ulcerative colitis.      Review of Systems  Constitutional: Positive for appetite change and unexpected weight change. Negative for fever, chills, weight loss, diaphoresis, activity change and fatigue.  HENT: Negative.  Negative for trouble swallowing.   Eyes: Negative.   Respiratory: Negative.  Negative for cough, choking, chest tightness, shortness of breath and stridor.   Cardiovascular: Negative.  Negative for chest pain, palpitations and leg swelling.  Gastrointestinal: Positive for nausea, abdominal pain, constipation, anorexia and flatus. Negative for vomiting, diarrhea, blood in stool, melena, hematochezia, abdominal distention, anal bleeding and rectal pain.  Endocrine: Negative.   Genitourinary: Negative.  Negative for dysuria, urgency, frequency, hematuria, flank pain and difficulty urinating.  Musculoskeletal: Negative.  Negative for arthralgias, back pain and myalgias.  Skin:  Negative.  Negative for rash.  Allergic/Immunologic: Negative.   Neurological: Negative.  Negative for dizziness, weakness and headaches.  Hematological: Negative.   Psychiatric/Behavioral: Negative.        Objective:   Physical Exam  Vitals reviewed. Constitutional: She is oriented to person, place, and time. She appears well-developed and well-nourished.  Non-toxic appearance. She does not have a sickly appearance. She does not appear ill. No distress.  HENT:  Head: Normocephalic and atraumatic.  Mouth/Throat: Oropharynx is clear and moist. No oropharyngeal exudate.  Eyes: Conjunctivae are normal. Right eye exhibits no discharge. Left eye exhibits no discharge. No scleral icterus.  Neck: Normal range of motion. Neck supple. No JVD present. No tracheal deviation present. No thyromegaly present.  Cardiovascular: Normal rate, regular rhythm, normal heart sounds and intact distal pulses.  Exam reveals no gallop and no friction rub.   No murmur heard. Pulmonary/Chest: Effort normal and breath sounds normal. No stridor. No respiratory distress. She has no wheezes. She has no rales. She exhibits no tenderness.  Abdominal: Soft. Normal appearance. She exhibits no shifting dullness, no distension, no pulsatile liver, no fluid wave, no abdominal bruit, no ascites, no pulsatile midline mass and no mass. Bowel sounds are decreased. There is no hepatosplenomegaly, splenomegaly or hepatomegaly. There is no tenderness. There is no rigidity, no rebound, no guarding, no CVA tenderness, no tenderness at McBurney's point and negative Murphy's sign. No hernia. Hernia confirmed negative in the ventral area, confirmed negative in the right inguinal area and confirmed negative in the left inguinal area.  Musculoskeletal: Normal range of motion. She exhibits no edema and no tenderness.  Lymphadenopathy:    She has no cervical adenopathy.  Neurological: She is oriented to person, place, and time.  Skin: Skin is  warm and dry. No  rash noted. She is not diaphoretic. No erythema.     Lab Results  Component Value Date   WBC 5.1 12/29/2013   HGB 13.9 12/29/2013   HCT 40.8 12/29/2013   PLT 164.0 12/29/2013   GLUCOSE 86 12/29/2013   CHOL 158 09/09/2013   TRIG 38 09/09/2013   HDL 57 09/09/2013   LDLCALC 93 09/09/2013   ALT 12 12/29/2013   AST 16 12/29/2013   NA 138 12/29/2013   K 4.2 12/29/2013   CL 103 12/29/2013   CREATININE 0.7 12/29/2013   BUN 13 12/29/2013   CO2 28 12/29/2013   TSH 0.91 12/29/2013       Assessment & Plan:

## 2014-03-03 NOTE — Patient Instructions (Signed)

## 2014-03-03 NOTE — Assessment & Plan Note (Signed)
Her exam is benign Will check a plain film for SBO, ileus, etc I think Linzess will help with this

## 2014-03-04 ENCOUNTER — Encounter: Payer: Self-pay | Admitting: Internal Medicine

## 2014-03-21 ENCOUNTER — Encounter: Payer: Self-pay | Admitting: Internal Medicine

## 2014-05-03 ENCOUNTER — Emergency Department (HOSPITAL_COMMUNITY): Payer: 59

## 2014-05-03 ENCOUNTER — Encounter (HOSPITAL_COMMUNITY): Payer: Self-pay | Admitting: Emergency Medicine

## 2014-05-03 ENCOUNTER — Emergency Department (HOSPITAL_COMMUNITY)
Admission: EM | Admit: 2014-05-03 | Discharge: 2014-05-03 | Disposition: A | Discharge status: Home / Self Care | Payer: 59 | Attending: Emergency Medicine | Admitting: Emergency Medicine

## 2014-05-03 DIAGNOSIS — R1012 Left upper quadrant pain: Secondary | ICD-10-CM | POA: Diagnosis present

## 2014-05-03 DIAGNOSIS — Z79899 Other long term (current) drug therapy: Secondary | ICD-10-CM | POA: Insufficient documentation

## 2014-05-03 DIAGNOSIS — J45909 Unspecified asthma, uncomplicated: Secondary | ICD-10-CM | POA: Diagnosis not present

## 2014-05-03 DIAGNOSIS — E079 Disorder of thyroid, unspecified: Secondary | ICD-10-CM | POA: Diagnosis not present

## 2014-05-03 DIAGNOSIS — Z8669 Personal history of other diseases of the nervous system and sense organs: Secondary | ICD-10-CM | POA: Insufficient documentation

## 2014-05-03 DIAGNOSIS — Z8742 Personal history of other diseases of the female genital tract: Secondary | ICD-10-CM | POA: Diagnosis not present

## 2014-05-03 DIAGNOSIS — Z87891 Personal history of nicotine dependence: Secondary | ICD-10-CM | POA: Insufficient documentation

## 2014-05-03 DIAGNOSIS — Z8619 Personal history of other infectious and parasitic diseases: Secondary | ICD-10-CM | POA: Diagnosis not present

## 2014-05-03 DIAGNOSIS — K589 Irritable bowel syndrome without diarrhea: Secondary | ICD-10-CM | POA: Diagnosis not present

## 2014-05-03 LAB — CBC WITH DIFFERENTIAL/PLATELET
Basophils Absolute: 0 10*3/uL (ref 0.0–0.1)
Basophils Relative: 1 % (ref 0–1)
Eosinophils Absolute: 0.3 10*3/uL (ref 0.0–0.7)
Eosinophils Relative: 6 % — ABNORMAL HIGH (ref 0–5)
HEMATOCRIT: 40.5 % (ref 36.0–46.0)
Hemoglobin: 13.8 g/dL (ref 12.0–15.0)
Lymphocytes Relative: 32 % (ref 12–46)
Lymphs Abs: 1.4 10*3/uL (ref 0.7–4.0)
MCH: 30.3 pg (ref 26.0–34.0)
MCHC: 34.1 g/dL (ref 30.0–36.0)
MCV: 89 fL (ref 78.0–100.0)
MONO ABS: 0.4 10*3/uL (ref 0.1–1.0)
Monocytes Relative: 8 % (ref 3–12)
Neutro Abs: 2.4 10*3/uL (ref 1.7–7.7)
Neutrophils Relative %: 53 % (ref 43–77)
Platelets: 161 10*3/uL (ref 150–400)
RBC: 4.55 MIL/uL (ref 3.87–5.11)
RDW: 12.4 % (ref 11.5–15.5)
WBC: 4.5 10*3/uL (ref 4.0–10.5)

## 2014-05-03 LAB — URINALYSIS, ROUTINE W REFLEX MICROSCOPIC
Bilirubin Urine: NEGATIVE
Glucose, UA: NEGATIVE mg/dL
Hgb urine dipstick: NEGATIVE
Ketones, ur: NEGATIVE mg/dL
LEUKOCYTES UA: NEGATIVE
Nitrite: NEGATIVE
Protein, ur: NEGATIVE mg/dL
Specific Gravity, Urine: 1.035 — ABNORMAL HIGH (ref 1.005–1.030)
Urobilinogen, UA: 0.2 mg/dL (ref 0.0–1.0)
pH: 5.5 (ref 5.0–8.0)

## 2014-05-03 LAB — I-STAT CHEM 8, ED
BUN: 9 mg/dL (ref 6–23)
CALCIUM ION: 1.21 mmol/L (ref 1.12–1.23)
Chloride: 105 mEq/L (ref 96–112)
Creatinine, Ser: 0.7 mg/dL (ref 0.50–1.10)
Glucose, Bld: 96 mg/dL (ref 70–99)
HCT: 41 % (ref 36.0–46.0)
HEMOGLOBIN: 13.9 g/dL (ref 12.0–15.0)
Potassium: 3.7 mEq/L (ref 3.7–5.3)
Sodium: 140 mEq/L (ref 137–147)
TCO2: 20 mmol/L (ref 0–100)

## 2014-05-03 MED ORDER — TRAMADOL HCL 50 MG PO TABS: 50.0000 mg | ORAL_TABLET | Freq: Four times a day (QID) | ORAL | Status: DC | PRN

## 2014-05-03 MED ORDER — MORPHINE SULFATE 4 MG/ML IJ SOLN
4.0000 mg | Freq: Once | INTRAMUSCULAR | Status: AC
  Administered 2014-05-03: 4 mg via INTRAVENOUS
  Filled 2014-05-03: qty 1

## 2014-05-03 MED ORDER — IOHEXOL 300 MG/ML  SOLN
50.0000 mL | Freq: Once | INTRAMUSCULAR | Status: AC | PRN
  Administered 2014-05-03: 50 mL via ORAL

## 2014-05-03 MED ORDER — SODIUM CHLORIDE 0.9 % IV SOLN
Freq: Once | INTRAVENOUS | Status: AC
  Administered 2014-05-03: 03:00:00 via INTRAVENOUS

## 2014-05-03 MED ORDER — MORPHINE SULFATE 4 MG/ML IJ SOLN: 4.0000 mg | Freq: Once | INTRAMUSCULAR | Status: DC | PRN

## 2014-05-03 MED ORDER — ONDANSETRON HCL 4 MG/2ML IJ SOLN
4.0000 mg | Freq: Once | INTRAMUSCULAR | Status: AC
  Administered 2014-05-03: 4 mg via INTRAVENOUS
  Filled 2014-05-03: qty 2

## 2014-05-03 MED ORDER — IOHEXOL 300 MG/ML  SOLN
80.0000 mL | Freq: Once | INTRAMUSCULAR | Status: AC | PRN
  Administered 2014-05-03: 80 mL via INTRAVENOUS

## 2014-05-03 NOTE — Discharge Instructions (Signed)
Please follow up with your primary care physician in 1-2 days. If you do not have one please call the [facility] number listed above. Please take pain medication and/or muscle relaxants as prescribed and as needed for pain. Please do not drive on narcotic pain medication or on muscle relaxants. Please read all discharge instructions and return precautions.    Irritable Bowel Syndrome Irritable bowel syndrome (IBS) is caused by a disturbance of normal bowel function and is a common digestive disorder. You may also hear this condition called spastic colon, mucous colitis, and irritable colon. There is no cure for IBS. However, symptoms often gradually improve or disappear with a good diet, stress management, and medicine. This condition usually appears in late adolescence or early adulthood. Women develop it twice as often as men. CAUSES  After food has been digested and absorbed in the small intestine, waste material is moved into the large intestine, or colon. In the colon, water and salts are absorbed from the undigested products coming from the small intestine. The remaining residue, or fecal material, is held for elimination. Under normal circumstances, gentle, rhythmic contractions of the bowel walls push the fecal material along the colon toward the rectum. In IBS, however, these contractions are irregular and poorly coordinated. The fecal material is either retained too long, resulting in constipation, or expelled too soon, producing diarrhea. SIGNS AND SYMPTOMS  The most common symptom of IBS is abdominal pain. It is often in the lower left side of the abdomen, but it may occur anywhere in the abdomen. The pain comes from spasms of the bowel muscles happening too much and from the buildup of gas and fecal material in the colon. This pain:  Can range from sharp abdominal cramps to a dull, continuous ache.  Often worsens soon after eating.  Is often relieved by having a bowel  movement or passing gas. Abdominal pain is usually accompanied by constipation, but it may also produce diarrhea. The diarrhea often occurs right after a meal or upon waking up in the morning. The stools are often soft, watery, and flecked with mucus. Other symptoms of IBS include:  Bloating.  Loss of appetite.  Heartburn.  Backache.  Dull pain in the arms or shoulders.  Nausea.  Burping.  Vomiting.  Gas. IBS may also cause symptoms that are unrelated to the digestive system, such as:  Fatigue.  Headaches.  Anxiety.  Shortness of breath.  Trouble concentrating.  Dizziness. These symptoms tend to come and go. DIAGNOSIS  The symptoms of IBS may seem like symptoms of other, more serious digestive disorders. Your health care provider may want to perform tests to exclude these disorders.  TREATMENT Many medicines are available to help correct bowel function or relieve bowel spasms and abdominal pain. Among the medicines available are:  Laxatives for severe constipation and to help restore normal bowel habits.  Specific antidiarrheal medicines to treat severe or lasting diarrhea.  Antispasmodic agents to relieve intestinal cramps. Your health care provider may also decide to treat you with a mild tranquilizer or sedative during unusually stressful periods in your life. Your health care provider may also prescribe antidepressant medicine. The use of this medicine has been shown to reduce pain and other symptoms of IBS. Remember that if any medicine is prescribed for you, you should take it exactly as directed. Make sure your health care provider knows how well it worked for you. HOME CARE INSTRUCTIONS   Take all medicines as directed by  your health care provider.  Avoid foods that are high in fat or oils, such as heavy cream, butter, frankfurters, sausage, and other fatty meats.  Avoid foods that make you go to the bathroom, such as fruit, fruit juice, and dairy  products.  Cut out carbonated drinks, chewing gum, and "gassy" foods such as beans and cabbage. This may help relieve bloating and burping.  Eat foods with bran, and drink plenty of liquids with the bran foods. This helps relieve constipation.  Keep track of what foods seem to bring on your symptoms.  Avoid emotionally charged situations or circumstances that produce anxiety.  Start or continue exercising.  Get plenty of rest and sleep. Document Released: 05/06/2005 Document Revised: 05/11/2013 Document Reviewed: 12/25/2007 [facility] Patient Information 2015 [place]. This information is not intended to replace advice given to you by your health care provider. Make sure you discuss any questions you have with your health care provider.

## 2014-05-03 NOTE — ED Provider Notes (Signed)
CSN: [id number]     Arrival date & time 05/03/14  0137 History   First MD Initiated Contact with Patient 05/03/14 0201     Chief Complaint  Patient presents with  . Abdominal Pain     (Consider location/radiation/quality/duration/timing/severity/associated sxs/prior Treatment) Patient is a 33 y.o. female presenting with abdominal pain. The history is provided by the patient.  Abdominal Pain Pain location:  LUQ, LLQ and RUQ Pain quality: aching, bloating and fullness   Pain severity:  Moderate Onset quality:  Gradual Duration:  2 days Timing:  Constant Progression:  Worsening Chronicity:  Recurrent Context: recent travel   Context: not diet changes, not eating, not laxative use, not medication withdrawal, not previous surgeries, not recent illness and not retching   Relieved by:  Nothing Associated symptoms: constipation, nausea and vomiting   Associated symptoms: no chest pain, no chills, no dysuria, no fever and no shortness of breath     Past Medical History  Diagnosis Date  . Renal disorder     under active right kidney  . Kidney infection   . Asthma   . Lyme disease   . Neuropathy   . Thyroid disease    Past Surgical History  Procedure Laterality Date  . Tubal ligation    . Breast enhancement surgery    . Augmentation mammaplasty     Family History  Problem Relation Age of Onset  . Heart disease Maternal Grandmother   . Depression Father   . Alcohol abuse Father   . Drug abuse Father   . Drug abuse Brother   . Cancer Neg Hx   . Diabetes Neg Hx   . COPD Neg Hx   . Hyperlipidemia Neg Hx   . Hypertension Neg Hx   . Kidney disease Neg Hx   . Stroke Neg Hx   . Birth defects Sister   . Early death Sister   . Birth defects Brother   . Early death Brother    History  Substance Use Topics  . Smoking status: Former Smoker    Types: Cigarettes    Quit date: 12/29/2012  . Smokeless tobacco: Never Used  . Alcohol Use: No     Comment: rarely   OB History     Gravida Para Term Preterm AB TAB SAB Ectopic Multiple Living   4    1     3      Review of Systems  Constitutional: Negative for fever and chills.  Respiratory: Negative for shortness of breath.   Cardiovascular: Negative for chest pain.  Gastrointestinal: Positive for nausea, vomiting, abdominal pain and constipation.       Having small watery stools decreased flatus  Genitourinary: Negative for dysuria and frequency.  Skin: Negative for rash.  All other systems reviewed and are negative.     Allergies  Flagyl  Home Medications   Prior to Admission medications   Medication Sig Start Date End Date Taking? Authorizing Provider  albuterol (PROVENTIL HFA;VENTOLIN HFA) 108 (90 BASE) MCG/ACT inhaler Inhale 2 puffs into the lungs every 6 (six) hours as needed. For emergency asthma symoms 01/04/14  Yes Etta Grandchildhomas L Jones, MD  Coconut Oil 1000 MG CAPS Take 1,000 mg by mouth daily.   Yes Historical Provider, MD  ibuprofen (ADVIL,MOTRIN) 200 MG tablet Take 800 mg by mouth every 8 (eight) hours as needed for pain.    Yes Historical Provider, MD  Linaclotide (LINZESS) 290 MCG CAPS capsule Take 1 capsule (290 mcg total) by mouth daily. Patient  taking differently: Take 290 mcg by mouth daily as needed (for mild constipation.).  03/03/14  Yes Etta Grandchildhomas L Jones, MD  Multiple Vitamin (MULTIVITAMIN) tablet Take 1 tablet by mouth daily.   Yes Historical Provider, MD  nitrofurantoin (MACRODANTIN) 50 MG capsule Take one as directed Patient taking differently: Take 50 mg by mouth as directed. 1 capsule after intercourse and 1 capsule the following morning 09/09/13  Yes Harrington ChallengerNancy J Young, NP  thyroid (ARMOUR) 30 MG tablet Take 1 tablet (30 mg total) by mouth daily before breakfast. 01/21/14  Yes Harrington ChallengerNancy J Young, NP  traMADol (ULTRAM) 50 MG tablet Take 1 tablet (50 mg total) by mouth every 6 (six) hours as needed. 05/03/14   Jennifer L Piepenbrink, PA-C   BP 116/67 mmHg  Pulse 76  Temp(Src) 98.5 F (36.9 C) (Oral)   Resp 16  SpO2 100%  LMP 04/08/2014 (Approximate) Physical Exam  Constitutional: She is oriented to person, place, and time. She appears well-developed and well-nourished.  HENT:  Head: Normocephalic.  Eyes: Pupils are equal, round, and reactive to light.  Neck: Normal range of motion.  Cardiovascular: Normal rate and regular rhythm.   Pulmonary/Chest: Effort normal and breath sounds normal.  Abdominal: Soft. She exhibits no distension. Bowel sounds are increased. There is tenderness.    Neurological: She is alert and oriented to person, place, and time.  Skin: Skin is warm.  Psychiatric: She has a normal mood and affect.  Vitals reviewed.   ED Course  Procedures (including critical care time) Labs Review Labs Reviewed  CBC WITH DIFFERENTIAL - Abnormal; Notable for the following:    Eosinophils Relative 6 (*)    All other components within normal limits  URINALYSIS, ROUTINE W REFLEX MICROSCOPIC - Abnormal; Notable for the following:    Specific Gravity, Urine 1.035 (*)    All other components within normal limits  I-STAT CHEM 8, ED    Imaging Review Ct Abdomen Pelvis W Contrast  05/03/2014   CLINICAL DATA:  Increasing abdominal pain radiating left to right.  EXAM: CT ABDOMEN AND PELVIS WITH CONTRAST  TECHNIQUE: Multidetector CT imaging of the abdomen and pelvis was performed using the standard protocol following bolus administration of intravenous contrast.  CONTRAST:  80mL OMNIPAQUE IOHEXOL 300 MG/ML  SOLN  COMPARISON:  Exam dictated in down time status and no pertinent comparison imaging available  FINDINGS: BODY WALL: Unremarkable.  LOWER CHEST: Unremarkable.  ABDOMEN/PELVIS:  Liver: No focal abnormality.  Biliary: No evidence of biliary obstruction or stone.  Pancreas: Unremarkable.  Spleen: Unremarkable.  Adrenals: Unremarkable.  Kidneys and ureters: No hydronephrosis or stone.  Bladder: Moderately distended. No obstructive process is identified. No wall thickening.   Reproductive: Corpus luteum noted on the right. No ovarian enlargement. Negative uterus. There is refluxing and mildly dilated left ovarian vein, which would not explain acute abdominal pain.  Bowel: The tip of the appendix is identified and is gas filled/negative. No inflammatory bowel wall thickening or obstruction.  Retroperitoneum: No mass or adenopathy.  Peritoneum: No ascites or pneumoperitoneum.  Vascular: No acute abnormality.  OSSEOUS: No acute abnormalities.  IMPRESSION: No acute findings to explain abdominal pain.   Electronically Signed   By: Tiburcio PeaJonathan  Watts M.D.   On: 05/03/2014 06:28     EKG Interpretation None      MDM   Final diagnoses:  IBS (irritable bowel syndrome)         Arman FilterGail K Kameron Glazebrook, NP 05/03/14 1947  Rolland PorterMark James, MD 05/07/14 1104

## 2014-05-03 NOTE — ED Notes (Signed)
Pt is c/o abd pain that is on the left side radiating to the right  Pt states the pain started about 1800 and has progressively gotten worse  Pt has nausea without vomiting  Pt states she has IBS and has been having small watery stools that do not relieve the pain

## 2014-05-03 NOTE — ED Notes (Signed)
Patient transported to CT 

## 2014-05-03 NOTE — ED Provider Notes (Signed)
Patient care acquired from Earley FavorGail Schulz, NP pending CT scan results.  Medications  morphine 4 MG/ML injection 4 mg (not administered)  0.9 %  sodium chloride infusion ( Intravenous Stopped 05/03/14 0321)  morphine 4 MG/ML injection 4 mg (4 mg Intravenous Given 05/03/14 0243)  ondansetron (ZOFRAN) injection 4 mg (4 mg Intravenous Given 05/03/14 0243)  iohexol (OMNIPAQUE) 300 MG/ML solution 50 mL (50 mLs Oral Contrast Given 05/03/14 0341)  iohexol (OMNIPAQUE) 300 MG/ML solution 80 mL (80 mLs Intravenous Contrast Given 05/03/14 0515)  morphine 4 MG/ML injection 4 mg (4 mg Intravenous Given 05/03/14 0538)    Results for orders placed or performed during the hospital encounter of 05/03/14  CBC with Differential  Result Value Ref Range   WBC 4.5 4.0 - 10.5 K/uL   RBC 4.55 3.87 - 5.11 MIL/uL   Hemoglobin 13.8 12.0 - 15.0 g/dL   HCT 16.140.5 09.636.0 - 04.546.0 %   MCV 89.0 78.0 - 100.0 fL   MCH 30.3 26.0 - 34.0 pg   MCHC 34.1 30.0 - 36.0 g/dL   RDW 40.912.4 81.111.5 - 91.415.5 %   Platelets 161 150 - 400 K/uL   Neutrophils Relative % 53 43 - 77 %   Neutro Abs 2.4 1.7 - 7.7 K/uL   Lymphocytes Relative 32 12 - 46 %   Lymphs Abs 1.4 0.7 - 4.0 K/uL   Monocytes Relative 8 3 - 12 %   Monocytes Absolute 0.4 0.1 - 1.0 K/uL   Eosinophils Relative 6 (H) 0 - 5 %   Eosinophils Absolute 0.3 0.0 - 0.7 K/uL   Basophils Relative 1 0 - 1 %   Basophils Absolute 0.0 0.0 - 0.1 K/uL  I-stat chem 8, ed  Result Value Ref Range   Sodium 140 137 - 147 mEq/L   Potassium 3.7 3.7 - 5.3 mEq/L   Chloride 105 96 - 112 mEq/L   BUN 9 6 - 23 mg/dL   Creatinine, Ser 7.820.70 0.50 - 1.10 mg/dL   Glucose, Bld 96 70 - 99 mg/dL   Calcium, Ion 9.561.21 2.131.12 - 1.23 mmol/L   TCO2 20 0 - 100 mmol/L   Hemoglobin 13.9 12.0 - 15.0 g/dL   HCT 08.641.0 57.836.0 - 46.946.0 %   No results found.  1. IBS (irritable bowel syndrome)    Filed Vitals:   05/03/14 0825  BP: 116/67  Pulse: 76  Temp: 98.5 F (36.9 C)  Resp: 16   Afebrile, NAD, non-toxic appearing,  AAOx4.  I have reviewed nursing notes, vital signs, and all appropriate lab and imaging results for this patient.  Patient feeling better after pain management.   Patient is nontoxic, nonseptic appearing, in no apparent distress.  Patient's pain and other symptoms adequately managed in emergency department.  Fluid bolus given.  Labs, imaging and vitals reviewed.  Patient does not meet the SIRS or Sepsis criteria.  On repeat exam patient does not have a surgical abdomen and there are no peritoneal signs.  No indication of appendicitis, bowel obstruction, bowel perforation, cholecystitis, diverticulitis.  Patient discharged home with symptomatic treatment and given strict instructions for follow-up with their primary care physician.  I have also discussed reasons to return immediately to the ER.  Patient expresses understanding and agrees with plan. Patient is stable at time of discharge      Jeannetta EllisJennifer L Istvan Behar, PA-C 05/03/14 0827  Rolland PorterMark James, MD 05/07/14 1104

## 2014-05-17 ENCOUNTER — Telehealth: Payer: Self-pay | Admitting: Obstetrics & Gynecology

## 2014-05-17 ENCOUNTER — Other Ambulatory Visit: Payer: Self-pay | Admitting: Women's Health

## 2014-05-17 ENCOUNTER — Other Ambulatory Visit: Payer: 59

## 2014-05-17 ENCOUNTER — Encounter: Payer: Self-pay | Admitting: Women's Health

## 2014-05-17 DIAGNOSIS — N390 Urinary tract infection, site not specified: Secondary | ICD-10-CM

## 2014-05-17 DIAGNOSIS — R399 Unspecified symptoms and signs involving the genitourinary system: Secondary | ICD-10-CM

## 2014-05-17 LAB — URINALYSIS W MICROSCOPIC + REFLEX CULTURE
Bilirubin Urine: NEGATIVE
CASTS: NONE SEEN
CRYSTALS: NONE SEEN
Glucose, UA: NEGATIVE mg/dL
KETONES UR: NEGATIVE mg/dL
Nitrite: NEGATIVE
PH: 7 (ref 5.0–8.0)
Protein, ur: NEGATIVE mg/dL
Urobilinogen, UA: 0.2 mg/dL (ref 0.0–1.0)

## 2014-05-17 MED ORDER — CIPROFLOXACIN HCL 500 MG PO TABS: 500.0000 mg | ORAL_TABLET | Freq: Two times a day (BID) | ORAL | Status: DC

## 2014-05-17 NOTE — Telephone Encounter (Signed)
Telephone call to patient-Patient was informed of UA results at office visit had been prescribed Cipro 500 for 5 days has already picked up prescription and is taking.

## 2014-05-17 NOTE — Telephone Encounter (Signed)
[facility] lab called with stat u/a results.  Reviewed chart.  Could not see where patient was called with results so called personally.  She was aware and started on Cipro 500mg  bid x 5 days.  Aware culture results will be communicated to her when finalized.  Thanked me for phone call.  Encounter closed.

## 2014-05-19 ENCOUNTER — Telehealth: Payer: Self-pay

## 2014-05-19 NOTE — Telephone Encounter (Signed)
Sounds like a good plan, she could take over-the-counter Azo which may also help symptoms.

## 2014-05-19 NOTE — Telephone Encounter (Signed)
Patient informed. 

## 2014-05-19 NOTE — Telephone Encounter (Signed)
-----   Message from Harrington ChallengerNancy J Young, NP sent at 05/19/2014  7:42 AM EST ----- Please call and review urine culture positive for small amount of bacteria, ask if she is feeling better if not let me know.

## 2014-05-19 NOTE — Telephone Encounter (Signed)
Patient said to let you know that she has taken four doses of the Cipro and still with pressure and a little burning. She said "like a poker feeling above my pubic bone where my bladder would be. "  She said she has never felt that before. She said to tell you she will finish the medication and see how goes and call Monday if not better -unless you want her to do something else.

## 2014-05-20 LAB — URINE CULTURE

## 2014-06-13 ENCOUNTER — Other Ambulatory Visit: Payer: 59

## 2014-06-13 ENCOUNTER — Encounter: Payer: Self-pay | Admitting: Internal Medicine

## 2014-06-13 ENCOUNTER — Other Ambulatory Visit: Payer: Self-pay | Admitting: Internal Medicine

## 2014-06-13 DIAGNOSIS — I519 Heart disease, unspecified: Principal | ICD-10-CM

## 2014-06-13 DIAGNOSIS — E038 Other specified hypothyroidism: Secondary | ICD-10-CM

## 2014-06-13 DIAGNOSIS — E039 Hypothyroidism, unspecified: Secondary | ICD-10-CM

## 2014-06-13 LAB — T3, FREE: T3 FREE: 2.6 pg/mL (ref 2.3–4.2)

## 2014-06-13 LAB — T4
T4, Total: 7.7 ug/dL (ref 4.5–12.0)
T4, Total: 7.4 ug/dL (ref 4.5–12.0)

## 2014-06-13 LAB — TSH: TSH: 0.795 u[IU]/mL (ref 0.350–4.500)

## 2014-06-14 ENCOUNTER — Encounter: Payer: Self-pay | Admitting: Internal Medicine

## 2014-09-22 ENCOUNTER — Encounter: Payer: Self-pay | Admitting: Women's Health

## 2014-09-22 ENCOUNTER — Ambulatory Visit (INDEPENDENT_AMBULATORY_CARE_PROVIDER_SITE_OTHER): Payer: 59 | Admitting: Women's Health

## 2014-09-22 ENCOUNTER — Other Ambulatory Visit: Payer: Self-pay | Admitting: Women's Health

## 2014-09-22 VITALS — BP 120/78 | Ht 62.0 in | Wt 114.0 lb

## 2014-09-22 DIAGNOSIS — E038 Other specified hypothyroidism: Secondary | ICD-10-CM

## 2014-09-22 DIAGNOSIS — N946 Dysmenorrhea, unspecified: Secondary | ICD-10-CM

## 2014-09-22 DIAGNOSIS — Z01419 Encounter for gynecological examination (general) (routine) without abnormal findings: Secondary | ICD-10-CM | POA: Diagnosis not present

## 2014-09-22 DIAGNOSIS — N390 Urinary tract infection, site not specified: Secondary | ICD-10-CM

## 2014-09-22 DIAGNOSIS — Z833 Family history of diabetes mellitus: Secondary | ICD-10-CM | POA: Diagnosis not present

## 2014-09-22 DIAGNOSIS — Z13220 Encounter for screening for lipoid disorders: Secondary | ICD-10-CM | POA: Diagnosis not present

## 2014-09-22 LAB — CBC WITH DIFFERENTIAL/PLATELET
Basophils Absolute: 0 10*3/uL (ref 0.0–0.1)
Basophils Relative: 1 % (ref 0–1)
EOS PCT: 5 % (ref 0–5)
Eosinophils Absolute: 0.2 10*3/uL (ref 0.0–0.7)
HCT: 41.9 % (ref 36.0–46.0)
HEMOGLOBIN: 14.1 g/dL (ref 12.0–15.0)
Lymphocytes Relative: 31 % (ref 12–46)
Lymphs Abs: 1 10*3/uL (ref 0.7–4.0)
MCH: 30.2 pg (ref 26.0–34.0)
MCHC: 33.7 g/dL (ref 30.0–36.0)
MCV: 89.7 fL (ref 78.0–100.0)
MPV: 11.3 fL (ref 8.6–12.4)
Monocytes Absolute: 0.4 10*3/uL (ref 0.1–1.0)
Monocytes Relative: 11 % (ref 3–12)
NEUTROS ABS: 1.7 10*3/uL (ref 1.7–7.7)
Neutrophils Relative %: 52 % (ref 43–77)
PLATELETS: 177 10*3/uL (ref 150–400)
RBC: 4.67 MIL/uL (ref 3.87–5.11)
RDW: 13.1 % (ref 11.5–15.5)
WBC: 3.3 10*3/uL — ABNORMAL LOW (ref 4.0–10.5)

## 2014-09-22 LAB — LIPID PANEL
CHOLESTEROL: 133 mg/dL (ref 0–200)
HDL: 51 mg/dL (ref >=46)
LDL CALC: 73 mg/dL (ref 0–99)
TRIGLYCERIDES: 43 mg/dL (ref <150)
Total CHOL/HDL Ratio: 2.6 Ratio
VLDL: 9 mg/dL (ref 0–40)

## 2014-09-22 LAB — GLUCOSE, RANDOM: Glucose, Bld: 75 mg/dL (ref 70–99)

## 2014-09-22 MED ORDER — THYROID 30 MG PO TABS: 30.0000 mg | ORAL_TABLET | Freq: Every day | ORAL | Status: AC

## 2014-09-22 MED ORDER — DIAZEPAM 2 MG PO TABS: ORAL_TABLET | ORAL | Status: DC

## 2014-09-22 MED ORDER — NITROFURANTOIN MACROCRYSTAL 50 MG PO CAPS: 50.0000 mg | ORAL_CAPSULE | ORAL | Status: AC

## 2014-09-22 MED ORDER — IBUPROFEN 600 MG PO TABS: 600.0000 mg | ORAL_TABLET | Freq: Three times a day (TID) | ORAL | Status: DC | PRN

## 2014-09-22 NOTE — Patient Instructions (Signed)

## 2014-09-22 NOTE — Progress Notes (Signed)
Adriyana Judie PetitM Barnett 03/25/1981 [id number]    History:    Presents for annual exam.  Monthly cycle/BTL/same partner. Normal Pap history. Chronic Lyme disease for greater than 2 years continues to have vertigo as main problem. Hypothyroid on Armour Synthroid.  Past medical history, past surgical history, family history and social history were all reviewed and documented in the EPIC chart. [facility]. Daughters ages 7812 and 4914, son 7016 and is autistic. Engaged for several years not planning marriage at this time, contemplating IVF.  ROS:  A ROS was performed and pertinent positives and negatives are included.  Exam:  Filed Vitals:   09/22/14 0804  BP: 120/78    General appearance:  Normal Thyroid:  Symmetrical, normal in size, without palpable masses or nodularity. Respiratory  Auscultation:  Clear without wheezing or rhonchi Cardiovascular  Auscultation:  Regular rate, without rubs, murmurs or gallops  Edema/varicosities:  Not grossly evident Abdominal  Soft,nontender, without masses, guarding or rebound.  Liver/spleen:  No organomegaly noted  Hernia:  None appreciated  Skin  Inspection:  Grossly normal   Breasts: Examined lying and sitting/saline implants.     Right: Without masses, retractions, discharge or axillary adenopathy.     Left: Without masses, retractions, discharge or axillary adenopathy. Gentitourinary   Inguinal/mons:  Normal without inguinal adenopathy  External genitalia:  Normal  BUS/Urethra/Skene's glands:  Normal  Vagina:  Normal  Cervix:  Normal  Uterus:   normal in size, shape and contour.  Midline and mobile  Adnexa/parametria:     Rt: Without masses or tenderness.   Lt: Without masses or tenderness.  Anus and perineum: Normal  Digital rectal exam: Normal sphincter tone without palpated masses or tenderness  Assessment/Plan:  34 y.o. DWF G3P3 for annual exam.    Monthly cycle/BTL/same partner Chronic Lyme disease for greater than 2 years  /vertigo symptom-primary care manages Hypothyroid on Armour Synthroid History of recurrent UTIs resolved with Macrodantin  Plan: CBC, TSH, glucose, lipid panel, UA, Pap normal with negative HR HPV 2015, new screening guidelines reviewed. SBE's, exercise, calcium rich diet, MVI daily encouraged. Reviewed referral for IVF if chosen. Armour Synthroid 30 mg by mouth daily, proper use, given and reviewed. Macrodantin 50 mg with intercourse, prescription, proper use given and reviewed. Aware of UTI prevention.   Harrington ChallengerYOUNG,NANCY J WHNP, 9:02 AM 09/22/2014

## 2014-09-23 ENCOUNTER — Encounter: Payer: Self-pay | Admitting: Women's Health

## 2014-09-23 LAB — URINALYSIS W MICROSCOPIC + REFLEX CULTURE
BILIRUBIN URINE: NEGATIVE
CRYSTALS: NONE SEEN
Casts: NONE SEEN
Glucose, UA: NEGATIVE mg/dL
Hgb urine dipstick: NEGATIVE
KETONES UR: NEGATIVE mg/dL
Leukocytes, UA: NEGATIVE
Nitrite: NEGATIVE
Protein, ur: NEGATIVE mg/dL
Specific Gravity, Urine: 1.019 (ref 1.005–1.030)
Urobilinogen, UA: 0.2 mg/dL (ref 0.0–1.0)
pH: 5 (ref 5.0–8.0)

## 2014-09-23 LAB — TSH: TSH: 0.973 u[IU]/mL (ref 0.350–4.500)

## 2014-09-24 LAB — URINE CULTURE: Colony Count: 5000

## 2014-12-08 ENCOUNTER — Encounter: Payer: Self-pay | Admitting: Internal Medicine

## 2015-01-24 ENCOUNTER — Ambulatory Visit (INDEPENDENT_AMBULATORY_CARE_PROVIDER_SITE_OTHER): Payer: 59

## 2015-01-24 ENCOUNTER — Ambulatory Visit (INDEPENDENT_AMBULATORY_CARE_PROVIDER_SITE_OTHER): Payer: 59 | Admitting: Podiatry

## 2015-01-24 ENCOUNTER — Encounter: Payer: Self-pay | Admitting: Podiatry

## 2015-01-24 VITALS — BP 124/89 | HR 82 | Resp 16

## 2015-01-24 DIAGNOSIS — M2040 Other hammer toe(s) (acquired), unspecified foot: Secondary | ICD-10-CM

## 2015-01-24 DIAGNOSIS — M2010 Hallux valgus (acquired), unspecified foot: Secondary | ICD-10-CM

## 2015-01-24 DIAGNOSIS — L603 Nail dystrophy: Secondary | ICD-10-CM

## 2015-01-24 DIAGNOSIS — M722 Plantar fascial fibromatosis: Secondary | ICD-10-CM | POA: Diagnosis not present

## 2015-01-24 NOTE — Patient Instructions (Signed)

## 2015-01-24 NOTE — Progress Notes (Signed)
   Subjective:    Patient ID: Cynthia Coleman, female    DOB: 1980-08-17, 34 y.o.   MRN: [id number]  HPI 34 year old white female presents today with a chief complaint of worsening bunions and curvatures of her toes. She states it is been getting worse for quite some time. She states that certain shoes really tend aggravate this particularly heels or tight shoes. She also states that she's had on and off heel pain associated with arch pain for the past 10 years she states that this comes and goes. She is also concerned about discoloration and thickening and the friability of her hallux nails bilateral.    Review of Systems  All other systems reviewed and are negative.      Objective:   Physical Exam: 34 year old female no acute distress vital signs are stable alert and oriented 3. Pulses are strongly palpable bilateral. Neurologic sensorium is intact for Semmes-Weinstein monofilament. Deep tendon reflexes are intact bilaterally muscle strength +5 over 5 dorsiflexion plantar flexors and inverters everters all intrinsic musculature is intact. Orthopedic evaluation demonstrates all joints distal to the ankle full range of motion without crepitation. She has mild hallux valgus deformity very flexible in nature. Flexible hammertoe deformities and curvature of the toes which are reproducible and reducible. She has mild pain on palpation of the medial longitudinal arch no pain on direct palpation of the medial calcaneal tubercles bilateral. Cutaneous evaluation demonstrates supple well-hydrated uterus with exception of some thickening and discoloration of the hallux nail plate bilateral. Cannot rule out onychomycosis though at this point no tinea pedis is found on the cutis at this point. Radiographic evaluation does demonstrate mild increase in the first intermetatarsal angle bilaterally right greater than left. Mild flexible hammertoe deformities are noted bilateral.        Assessment & Plan:  Hallux  abductovalgus deformity bilateral. Hammertoe deformities bilateral. Nail dystrophy hallux bilateral.  Plan: Discussed etiology pathology conservative versus surgical therapies. At this point I think orthotics will be her best bet to help slow down the progression of the valgus deformity. I also took samples of the nails today sent for pathologic evaluation. We did discuss the etiology pathology conservative versus surgical therapies regarding bunion and hammertoe repair. She will keep this in mind and notify me as her symptoms increase.  Arbutus Ped DPM

## 2015-02-07 ENCOUNTER — Encounter: Payer: Self-pay | Admitting: Podiatry

## 2015-02-14 ENCOUNTER — Ambulatory Visit: Payer: 59 | Admitting: *Deleted

## 2015-02-14 DIAGNOSIS — M722 Plantar fascial fibromatosis: Secondary | ICD-10-CM

## 2015-02-14 NOTE — Patient Instructions (Signed)

## 2015-02-14 NOTE — Progress Notes (Signed)
Patient ID: Cynthia Coleman, female   DOB: Jul 15, 1980, 34 y.o.   MRN: [id number] Patient presents for orthotic pick up.  Verbal and written break in and wear instructions given.  Patient will follow up in 4 weeks if symptoms worsen or fail to improve.

## 2015-02-15 ENCOUNTER — Encounter: Payer: Self-pay | Admitting: Internal Medicine

## 2015-02-15 ENCOUNTER — Other Ambulatory Visit: Payer: Self-pay | Admitting: Internal Medicine

## 2015-02-15 DIAGNOSIS — M4802 Spinal stenosis, cervical region: Secondary | ICD-10-CM | POA: Insufficient documentation

## 2015-02-15 DIAGNOSIS — M5412 Radiculopathy, cervical region: Secondary | ICD-10-CM

## 2015-02-16 ENCOUNTER — Telehealth: Payer: Self-pay | Admitting: *Deleted

## 2015-02-16 NOTE — Telephone Encounter (Signed)
Dr. Al Corpus reviewed 01/24/2015 fungal cultures as negative, recommended urea cream.  Informed pt of the results and [id number] use to thin toenails.

## 2015-03-07 ENCOUNTER — Other Ambulatory Visit: Payer: 59

## 2015-03-11 ENCOUNTER — Other Ambulatory Visit: Payer: 59

## 2015-03-13 ENCOUNTER — Ambulatory Visit (HOSPITAL_COMMUNITY): Payer: 59

## 2015-03-14 ENCOUNTER — Ambulatory Visit
Admission: RE | Admit: 2015-03-14 | Discharge: 2015-03-14 | Disposition: A | Discharge status: Home / Self Care | Payer: 59 | Source: Ambulatory Visit | Attending: Internal Medicine | Admitting: Internal Medicine

## 2015-03-14 DIAGNOSIS — M5412 Radiculopathy, cervical region: Secondary | ICD-10-CM

## 2015-03-15 ENCOUNTER — Encounter: Payer: Self-pay | Admitting: Internal Medicine

## 2015-03-20 ENCOUNTER — Encounter: Payer: Self-pay | Admitting: Internal Medicine

## 2015-03-20 ENCOUNTER — Ambulatory Visit (INDEPENDENT_AMBULATORY_CARE_PROVIDER_SITE_OTHER): Payer: 59 | Admitting: Internal Medicine

## 2015-03-20 VITALS — BP 126/70 | HR 70 | Temp 98.4°F | Resp 16 | Ht 62.0 in | Wt 113.0 lb

## 2015-03-20 DIAGNOSIS — K5909 Other constipation: Secondary | ICD-10-CM

## 2015-03-20 DIAGNOSIS — M4802 Spinal stenosis, cervical region: Secondary | ICD-10-CM | POA: Diagnosis not present

## 2015-03-20 DIAGNOSIS — K589 Irritable bowel syndrome without diarrhea: Secondary | ICD-10-CM

## 2015-03-20 MED ORDER — LUBIPROSTONE 8 MCG PO CAPS: 8.0000 ug | ORAL_CAPSULE | Freq: Two times a day (BID) | ORAL | Status: AC

## 2015-03-20 NOTE — Progress Notes (Signed)
Pre visit review using our clinic review tool, if applicable. No additional management support is needed unless otherwise documented below in the visit note. 

## 2015-03-20 NOTE — Progress Notes (Signed)
Subjective:  Patient ID: Cynthia Fallenawn M Gentle, female    DOB: 02/27/1981  Age: 34 y.o. MRN: [id number]  CC: Neck Pain   HPI Cynthia FallenDawn M Coleman presents for follow-up on neck pain. She recently had an MRI done that showed mild spinal stenosis. She tells me that she has had neck pain for several years but it is worsened over the last few months. She describes the pain is sharp and stabbing and says it radiates into her left upper extremity. She also reports tingling diffusely in her left upper extremity and hand. She is taking Motrin for symptom relief. She wants to see neurosurgery. She also requests something for IBS with constipation. She tried Linzess but says it caused diarrhea. She complains of intermittent abdominal bloating and constipation.  Outpatient Prescriptions Prior to Visit  Medication Sig Dispense Refill  . albuterol (PROVENTIL HFA;VENTOLIN HFA) 108 (90 BASE) MCG/ACT inhaler Inhale 2 puffs into the lungs every 6 (six) hours as needed. For emergency asthma symoms 18 g 1  . Coconut Oil 1000 MG CAPS Take 1,000 mg by mouth daily.    Marland Kitchen. ibuprofen (ADVIL,MOTRIN) 600 MG tablet Take 1 tablet (600 mg total) by mouth every 8 (eight) hours as needed. 60 tablet 1  . Multiple Vitamin (MULTIVITAMIN) tablet Take 1 tablet by mouth daily.    . nitrofurantoin (MACRODANTIN) 50 MG capsule Take 1 capsule (50 mg total) by mouth as directed. 1 capsule after intercourse and 1 capsule the following morning 30 capsule 6  . thyroid (ARMOUR) 30 MG tablet Take 1 tablet (30 mg total) by mouth daily before breakfast. 90 tablet 4  . diazepam (VALIUM) 2 MG tablet Take 1 tablet daily as needed 30 tablet 1   No facility-administered medications prior to visit.    ROS Review of Systems  Constitutional: Negative.  Negative for fever, chills, diaphoresis, appetite change and fatigue.  HENT: Negative.  Negative for sore throat, trouble swallowing and voice change.   Eyes: Negative.  Negative for visual disturbance.    Respiratory: Negative.   Cardiovascular: Negative.  Negative for chest pain, palpitations and leg swelling.  Gastrointestinal: Positive for constipation. Negative for nausea, vomiting, abdominal pain, diarrhea and blood in stool.  Endocrine: Negative.   Genitourinary: Negative.   Musculoskeletal: Positive for neck pain. Negative for myalgias, back pain, arthralgias, gait problem and neck stiffness.  Skin: Negative.  Negative for rash.  Allergic/Immunologic: Negative.   Neurological: Negative.  Negative for dizziness, tremors, weakness, numbness and headaches.  Hematological: Negative.  Negative for adenopathy. Does not bruise/bleed easily.  Psychiatric/Behavioral: Negative.  Negative for suicidal ideas and decreased concentration. The patient is not nervous/anxious.     Objective:  BP 126/70 mmHg  Pulse 70  Temp(Src) 98.4 F (36.9 C) (Oral)  Resp 16  Ht 5\' 2"  (1.575 m)  Wt 113 lb (51.256 kg)  BMI 20.66 kg/m2  SpO2 97%  LMP 03/12/2015  BP Readings from Last 3 Encounters:  03/20/15 126/70  01/24/15 124/89  09/22/14 120/78    Wt Readings from Last 3 Encounters:  03/20/15 113 lb (51.256 kg)  03/14/15 112 lb (50.803 kg)  09/22/14 114 lb (51.71 kg)    Physical Exam  Constitutional: She is oriented to person, place, and time. She appears well-developed and well-nourished. No distress.  HENT:  Head: Normocephalic and atraumatic.  Mouth/Throat: Oropharynx is clear and moist. No oropharyngeal exudate.  Eyes: Right eye exhibits no discharge. Left eye exhibits no discharge. No scleral icterus.  Neck: Normal range of motion.  Neck supple. No JVD present. No tracheal deviation present. No thyromegaly present.  Cardiovascular: Normal rate, regular rhythm, normal heart sounds and intact distal pulses.  Exam reveals no gallop and no friction rub.   No murmur heard. Pulmonary/Chest: Effort normal and breath sounds normal. No stridor. No respiratory distress. She has no wheezes. She has  no rales. She exhibits no tenderness.  Abdominal: Soft. Bowel sounds are normal. She exhibits no distension and no mass. There is no tenderness. There is no rebound and no guarding.  Musculoskeletal: Normal range of motion. She exhibits no edema or tenderness.  Lymphadenopathy:    She has no cervical adenopathy.  Neurological: She is alert and oriented to person, place, and time. She has normal strength. She displays no atrophy, no tremor and normal reflexes. No cranial nerve deficit or sensory deficit. She exhibits normal muscle tone. She displays a negative Romberg sign. She displays no seizure activity. Coordination and gait normal.  Reflex Scores:      Tricep reflexes are 1+ on the right side and 1+ on the left side.      Bicep reflexes are 1+ on the right side and 1+ on the left side.      Brachioradialis reflexes are 1+ on the right side and 1+ on the left side.      Patellar reflexes are 2+ on the right side.      Achilles reflexes are 1+ on the right side and 1+ on the left side. Skin: Skin is warm and dry. No rash noted. She is not diaphoretic. No erythema. No pallor.  Psychiatric: She has a normal mood and affect. Her behavior is normal. Judgment and thought content normal.    Lab Results  Component Value Date   WBC 3.3* 09/22/2014   HGB 14.1 09/22/2014   HCT 41.9 09/22/2014   PLT 177 09/22/2014   GLUCOSE 75 09/22/2014   CHOL 133 09/22/2014   TRIG 43 09/22/2014   HDL 51 09/22/2014   LDLCALC 73 09/22/2014   ALT 12 12/29/2013   AST 16 12/29/2013   NA 140 05/03/2014   K 3.7 05/03/2014   CL 105 05/03/2014   CREATININE 0.70 05/03/2014   BUN 9 05/03/2014   CO2 28 12/29/2013   TSH 0.973 09/22/2014    Mr Cervical Spine Wo Contrast  03/14/2015  CLINICAL DATA:  Cervical radiculitis. Left-sided neck pain. Left arm pain with numbness and tingling. EXAM: MRI CERVICAL SPINE WITHOUT CONTRAST TECHNIQUE: Multiplanar, multisequence MR imaging of the cervical spine was performed. No  intravenous contrast was administered. COMPARISON:  None. FINDINGS: There is trace retrolisthesis of C3 on C4 in C5 on C6. Vertebral body heights are preserved. Mild disc space narrowing is present at C5-6. Disc desiccation is noted throughout the cervical spine. No significant vertebral marrow edema is seen. Craniocervical junction is unremarkable. Cervical spinal cord is normal in caliber and signal. Paraspinal soft tissues are unremarkable. C2-3:  Negative. C3-4:  Minimal disc bulging without stenosis. C4-5:  Minimal disc bulging without stenosis. C5-6: Broad-based posterior disc osteophyte complex slightly asymmetric to the left results in mild spinal stenosis without significant neural foraminal stenosis. C6-7:  Shallow right paracentral disc protrusion without stenosis. C7-T1:  Negative. IMPRESSION: Mild cervical disc degeneration, worst at C5-6 where there is mild spinal stenosis. Electronically Signed   By: Sebastian Ache M.D.   On: 03/14/2015 21:32    Assessment & Plan:   Beckey was seen today for neck pain.  Diagnoses and all orders for this  visit:  Spinal stenosis of cervical region- the spinal stenosis is described as mild on an MRI. Her pain is controlled with Motrin. Her neurologic exam is unremarkable. I doubt she is a candidate for neurosurgery but she is determined to see a neurosurgeon so I have sent a referral. -     Ambulatory referral to Neurosurgery  IBS (irritable bowel syndrome)- will try Amitiza -     lubiprostone (AMITIZA) 8 MCG capsule; Take 1 capsule (8 mcg total) by mouth 2 (two) times daily with a meal.  Other constipation- will start Amitiza   I have discontinued Ms. Gielow's diazepam. I am also having her start on lubiprostone. Additionally, I am having her maintain her multivitamin, albuterol, Coconut Oil, thyroid, nitrofurantoin, and ibuprofen.  Meds ordered this encounter  Medications  . lubiprostone (AMITIZA) 8 MCG capsule    Sig: Take 1 capsule (8 mcg total) by  mouth 2 (two) times daily with a meal.    Dispense:  60 capsule    Refill:  11     Follow-up: Return if symptoms worsen or fail to improve.  Sanda Linger, MD

## 2015-03-20 NOTE — Patient Instructions (Signed)

## 2015-04-07 ENCOUNTER — Encounter: Payer: Self-pay | Admitting: Podiatry

## 2015-05-19 ENCOUNTER — Other Ambulatory Visit: Payer: Self-pay | Admitting: Women's Health

## 2015-05-19 ENCOUNTER — Encounter: Payer: Self-pay | Admitting: Women's Health

## 2015-05-19 DIAGNOSIS — N946 Dysmenorrhea, unspecified: Secondary | ICD-10-CM

## 2015-05-19 MED ORDER — IBUPROFEN 600 MG PO TABS: 600.0000 mg | ORAL_TABLET | Freq: Three times a day (TID) | ORAL | Status: AC | PRN

## 2015-06-29 ENCOUNTER — Encounter: Payer: Self-pay | Admitting: Women's Health

## 2015-06-29 ENCOUNTER — Other Ambulatory Visit: Payer: Self-pay

## 2015-06-29 MED ORDER — DIAZEPAM 2 MG PO TABS: 2.0000 mg | ORAL_TABLET | Freq: Four times a day (QID) | ORAL | Status: AC | PRN

## 2015-06-29 NOTE — Telephone Encounter (Signed)
Called in to pharmacy. I emailed patient back in My chart.

## 2015-06-29 NOTE — Telephone Encounter (Addendum)
Patient sent email in My Chart asking for refill on valium.  CE is scheduled 09/27/2014.

## 2015-06-29 NOTE — Telephone Encounter (Signed)
Used in past aware, to use sparingly, has used less than 1 rx in a year.  Please call in valium  prn daily #30.

## 2015-08-09 ENCOUNTER — Other Ambulatory Visit: Payer: Self-pay | Admitting: Internal Medicine

## 2015-08-09 MED FILL — diazePAM 2 MG TABS: 2 | 7 days supply | Qty: 30 | Fill #0

## 2015-08-09 MED FILL — NITROFURANTOIN MCR 50 MG CA: 50 | 15 days supply | Qty: 30 | Fill #0

## 2015-08-09 MED FILL — ARMOUR THYROID 30 MG TABLET: 30 | 30 days supply | Qty: 30 | Fill #0

## 2015-08-10 MED FILL — VENTOLIN HFA 90 MCG INHALER: 108 (90 BAS | 25 days supply | Qty: 18 | Fill #0

## 2015-09-27 ENCOUNTER — Encounter: Payer: 59 | Admitting: Women's Health

## 2015-11-22 ENCOUNTER — Encounter: Payer: Self-pay | Admitting: Women's Health

## 2016-08-15 IMAGING — US US ABDOMEN COMPLETE
1 series · 14 of 25 positions shown · non-contrast
Comparison: None.

CLINICAL DATA: Abdominal pain.  Right upper quadrant pain

EXAM:
ULTRASOUND ABDOMEN COMPLETE

[Series 1: us abdomen complete · 0.30mm/px · 14 of 77 slices shown]
[im 1/77]
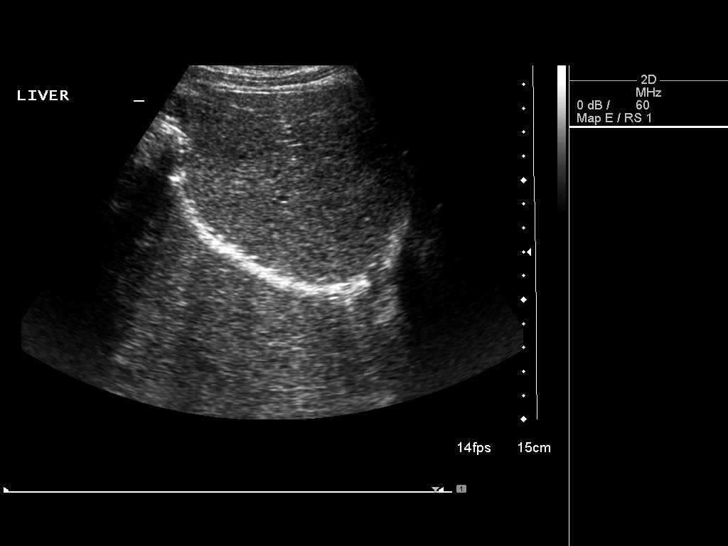
[im 7/77]
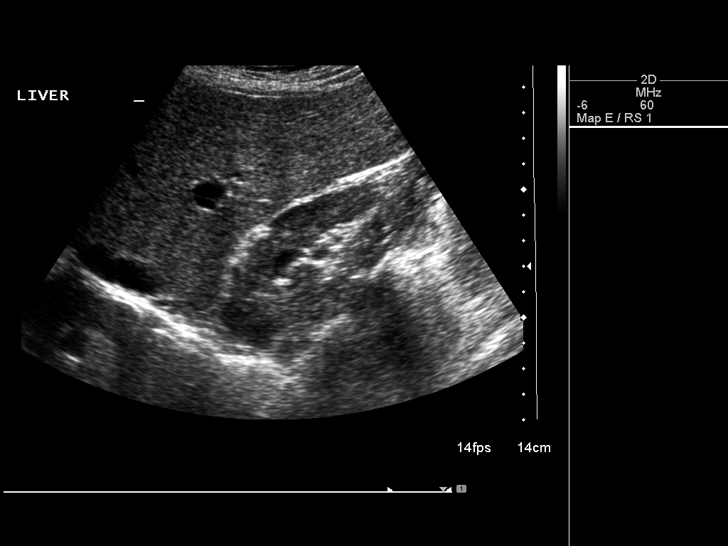
[im 13/77]
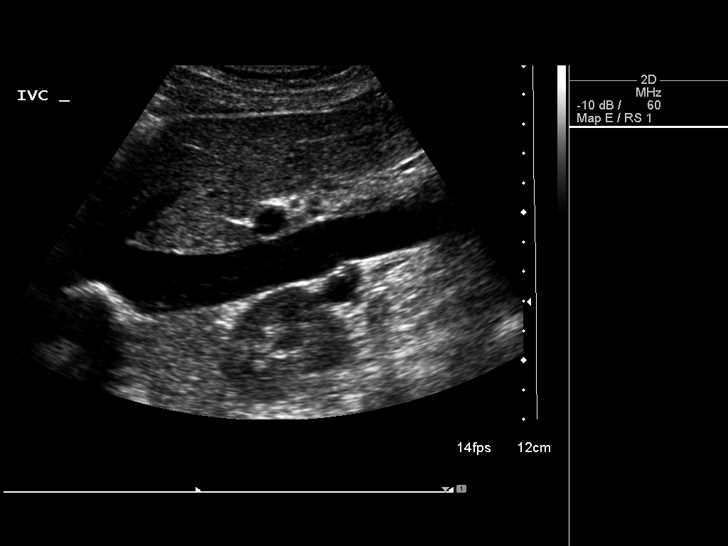
[im 20/77]
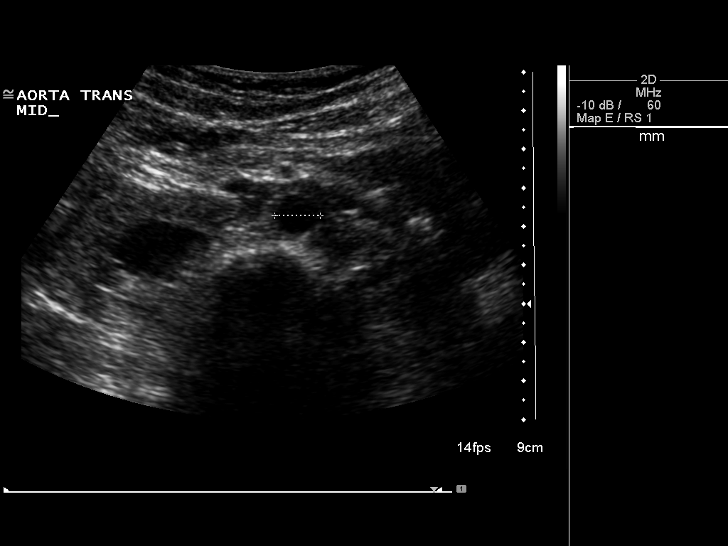
[im 26/77]
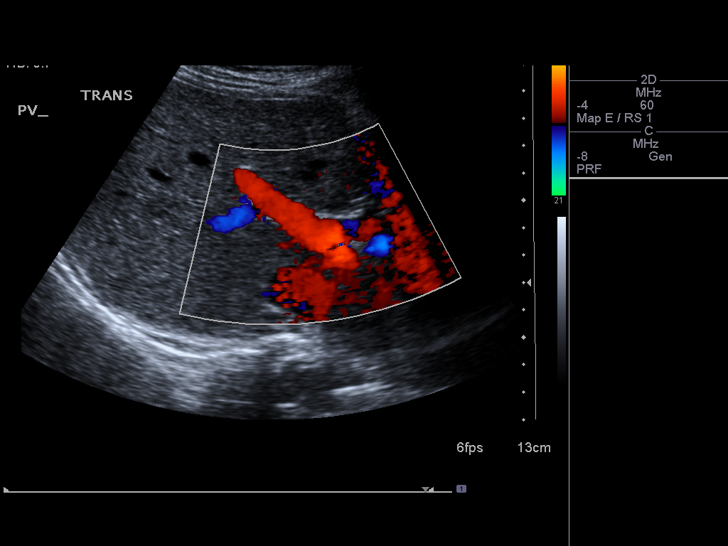
[im 29/77]
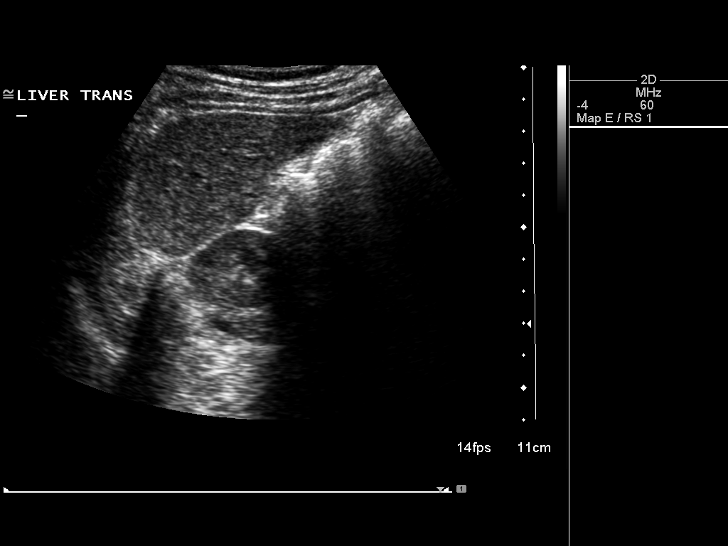
[im 35/77]
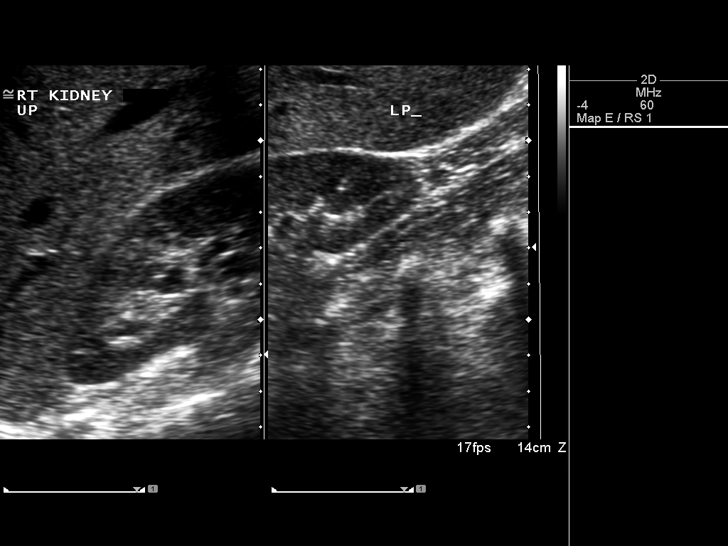
[im 42/77]
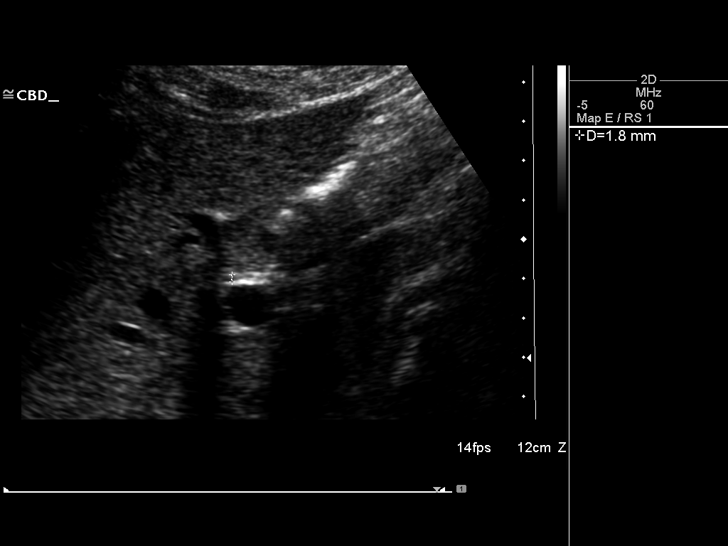
[im 48/77]
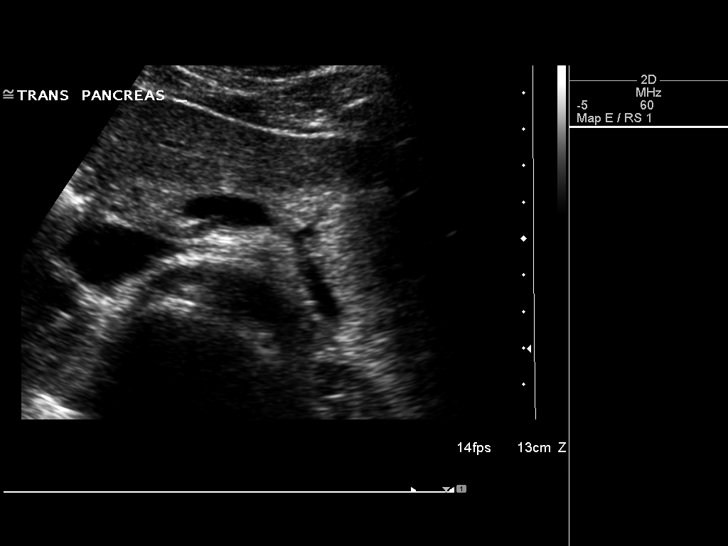
[im 51/77]
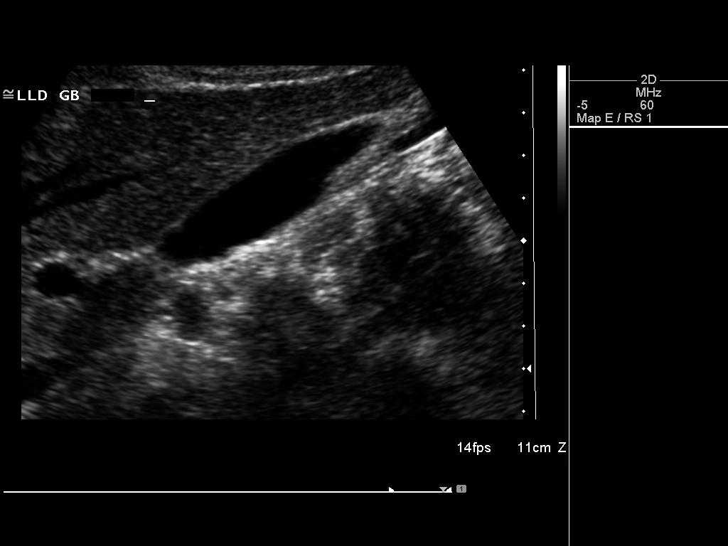
[im 58/77]
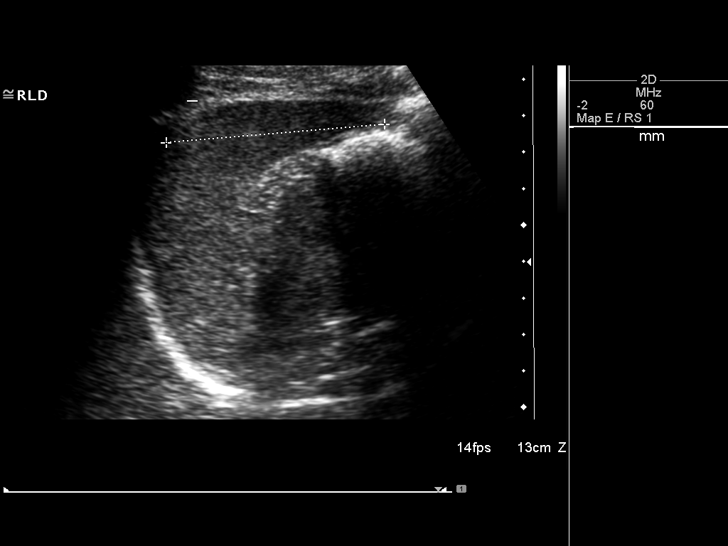
[im 64/77]
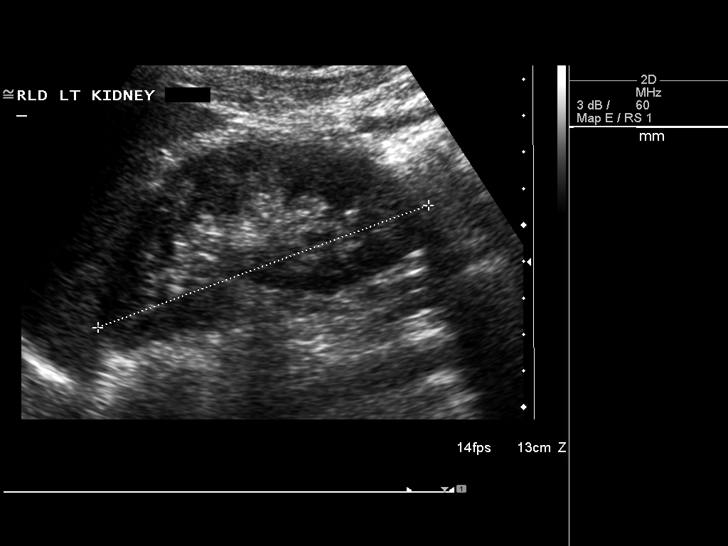
[im 70/77]
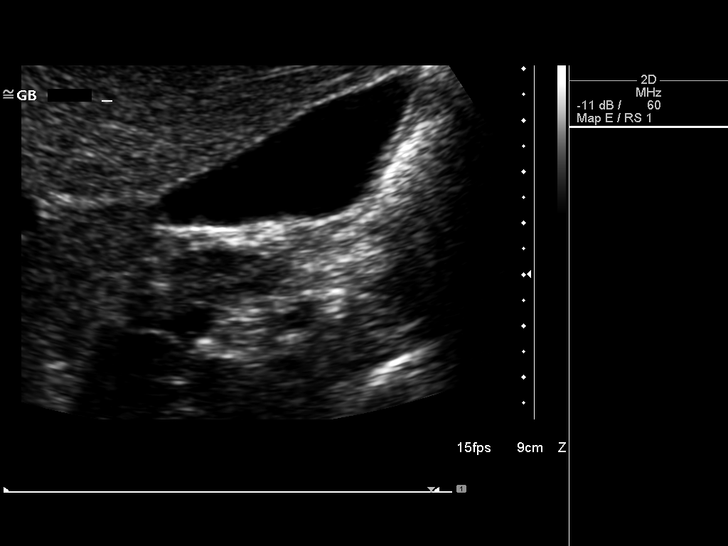
[im 77/77]
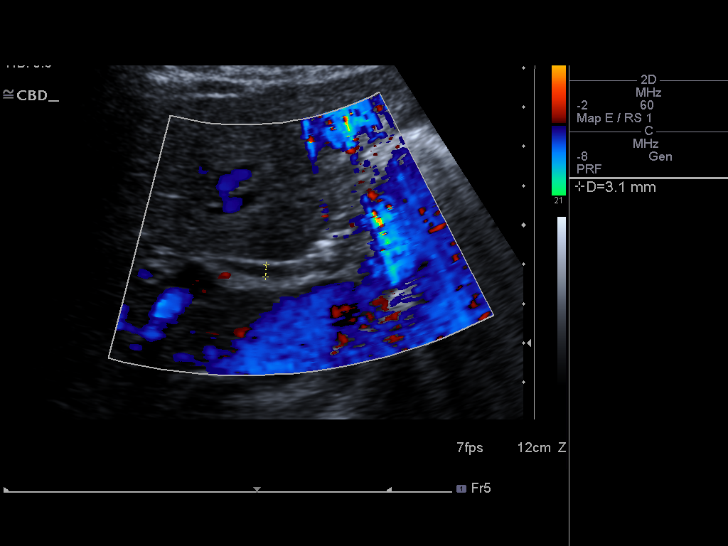

[14 of 25 positions shown; findings below may reference images not displayed]

FINDINGS: Gallbladder:

No gallstones or wall thickening visualized. No sonographic Murphy
sign noted.

Common bile duct:

Diameter: 3.1 mm

Liver:

No focal lesion identified. Within normal limits in parenchymal
echogenicity.

IVC:

No abnormality visualized.

Pancreas:

Visualized portion unremarkable.

Spleen:

Size and appearance within normal limits.

Right Kidney:

Length: 9.9 cm. Echogenicity within normal limits. No mass or
hydronephrosis visualized.

Left Kidney:

Length: 9.7 cm. Echogenicity within normal limits. No mass or
hydronephrosis visualized.

Abdominal aorta:

No aneurysm visualized.

Other findings:

None.
IMPRESSION: Negative

## 2016-10-02 ENCOUNTER — Encounter: Payer: Self-pay | Admitting: Gynecology
